# Patient Record
Sex: Male | Born: 1955 | Race: White | Hispanic: No | Marital: Married | Smoking: Never smoker
Health system: Southern US, Community
[De-identification: ages and names within clinical notes are randomized; demographics above are authoritative.]

## PROBLEM LIST (undated history)

## (undated) DIAGNOSIS — I712 Thoracic aortic aneurysm, without rupture: Secondary | ICD-10-CM

## (undated) DIAGNOSIS — R911 Solitary pulmonary nodule: Secondary | ICD-10-CM

## (undated) DIAGNOSIS — E785 Hyperlipidemia, unspecified: Secondary | ICD-10-CM

## (undated) DIAGNOSIS — W3400XA Accidental discharge from unspecified firearms or gun, initial encounter: Secondary | ICD-10-CM

## (undated) DIAGNOSIS — I10 Essential (primary) hypertension: Secondary | ICD-10-CM

## (undated) DIAGNOSIS — I2510 Atherosclerotic heart disease of native coronary artery without angina pectoris: Secondary | ICD-10-CM

## (undated) DIAGNOSIS — F419 Anxiety disorder, unspecified: Secondary | ICD-10-CM

## (undated) DIAGNOSIS — Z136 Encounter for screening for cardiovascular disorders: Secondary | ICD-10-CM

## (undated) HISTORY — PX: HERNIA REPAIR: SHX51

---

## 2000-09-16 ENCOUNTER — Emergency Department (HOSPITAL_COMMUNITY): Admission: EM | Admit: 2000-09-16 | Discharge: 2000-09-16 | Payer: Self-pay

## 2001-04-05 ENCOUNTER — Ambulatory Visit (HOSPITAL_COMMUNITY): Admission: RE | Admit: 2001-04-05 | Discharge: 2001-04-05 | Payer: Self-pay | Admitting: Orthopedic Surgery

## 2001-04-05 ENCOUNTER — Encounter: Payer: Self-pay | Admitting: Orthopedic Surgery

## 2010-02-18 ENCOUNTER — Emergency Department (HOSPITAL_COMMUNITY)
Admission: EM | Admit: 2010-02-18 | Discharge: 2010-02-18 | Payer: Self-pay | Source: Home / Self Care | Admitting: Emergency Medicine

## 2010-02-21 ENCOUNTER — Encounter (INDEPENDENT_AMBULATORY_CARE_PROVIDER_SITE_OTHER): Payer: Self-pay | Admitting: General Surgery

## 2010-02-21 ENCOUNTER — Ambulatory Visit
Admission: RE | Admit: 2010-02-21 | Discharge: 2010-02-21 | Payer: Self-pay | Source: Home / Self Care | Admitting: General Surgery

## 2010-05-28 LAB — DIFFERENTIAL
Basophils Absolute: 0 10*3/uL (ref 0.0–0.1)
Basophils Relative: 0 % (ref 0–1)
Eosinophils Absolute: 0.1 10*3/uL (ref 0.0–0.7)
Eosinophils Relative: 1 % (ref 0–5)
Lymphocytes Relative: 34 % (ref 12–46)
Lymphs Abs: 2.5 10*3/uL (ref 0.7–4.0)
Monocytes Absolute: 0.4 10*3/uL (ref 0.1–1.0)
Monocytes Relative: 5 % (ref 3–12)
Neutro Abs: 4.5 10*3/uL (ref 1.7–7.7)
Neutrophils Relative %: 60 % (ref 43–77)

## 2010-05-28 LAB — POCT I-STAT, CHEM 8
BUN: 15 mg/dL (ref 6–23)
Calcium, Ion: 1.02 mmol/L — ABNORMAL LOW (ref 1.12–1.32)
Chloride: 107 mEq/L (ref 96–112)
Creatinine, Ser: 1.2 mg/dL (ref 0.4–1.5)
Glucose, Bld: 122 mg/dL — ABNORMAL HIGH (ref 70–99)
HCT: 40 % (ref 39.0–52.0)
Hemoglobin: 13.6 g/dL (ref 13.0–17.0)
Potassium: 3.6 mEq/L (ref 3.5–5.1)
Sodium: 139 mEq/L (ref 135–145)
TCO2: 23 mmol/L (ref 0–100)

## 2010-05-28 LAB — CBC
HCT: 39.3 % (ref 39.0–52.0)
Hemoglobin: 13.4 g/dL (ref 13.0–17.0)
MCH: 30.4 pg (ref 26.0–34.0)
MCHC: 34.1 g/dL (ref 30.0–36.0)
MCV: 89.1 fL (ref 78.0–100.0)
Platelets: 208 10*3/uL (ref 150–400)
RBC: 4.41 MIL/uL (ref 4.22–5.81)
RDW: 12.4 % (ref 11.5–15.5)
WBC: 7.5 10*3/uL (ref 4.0–10.5)

## 2010-07-16 ENCOUNTER — Other Ambulatory Visit: Payer: Self-pay | Admitting: Surgery

## 2010-07-16 ENCOUNTER — Ambulatory Visit
Admission: RE | Admit: 2010-07-16 | Discharge: 2010-07-16 | Disposition: A | Discharge status: Home / Self Care | Payer: Self-pay | Source: Ambulatory Visit | Attending: Surgery | Admitting: Surgery

## 2010-07-16 DIAGNOSIS — Z01811 Encounter for preprocedural respiratory examination: Secondary | ICD-10-CM

## 2010-09-20 ENCOUNTER — Ambulatory Visit (INDEPENDENT_AMBULATORY_CARE_PROVIDER_SITE_OTHER): Payer: Worker's Compensation | Admitting: Surgery

## 2010-09-20 ENCOUNTER — Encounter (INDEPENDENT_AMBULATORY_CARE_PROVIDER_SITE_OTHER): Payer: Self-pay | Admitting: Surgery

## 2010-09-20 VITALS — BP 122/83 | HR 77 | Ht 70.0 in | Wt 205.0 lb

## 2010-09-20 DIAGNOSIS — Z8719 Personal history of other diseases of the digestive system: Secondary | ICD-10-CM

## 2010-09-20 DIAGNOSIS — Z9889 Other specified postprocedural states: Secondary | ICD-10-CM

## 2010-09-20 NOTE — Progress Notes (Signed)
Subjective:     Patient ID: Rodney Santos, male   DOB: 08/01/1955, 55 y.o.   MRN: [id number]    BP 122/83  Pulse 77  Ht 5\' 10"  (1.778 m)  Wt 205 lb (92.987 kg)  BMI 29.41 kg/m2    HPI The patient returns after laparoscopic repair of his right inguinal hernia. He seems to be doing okay. He denies any severe pain. He still has mild swelling of his right groin. Denies any redness or drainage from his incisions. He states he [place] the [place] while walking. Otherwise, he thinks he's doing well.  Review of Systems  Leg cramp and groin swelling     Objective:   Physical Exam  The abdominal incisions are well healed. Mild swelling of the right groin noted. No evidence of recurrent hernia with Valsalva. No redness.     Assessment:    Status post laparoscopic repair of right inguinal hernia Plan:    He is doing well. He will return to work in 10 days. He will have no restrictions. Followup as needed.

## 2010-09-20 NOTE — Patient Instructions (Signed)
Return to work on 09/30/2010.  No restrictions.  The swelling will go down over the next 2-3 months.  If the leg cramps continue call primary care.

## 2015-06-07 ENCOUNTER — Ambulatory Visit: Payer: Self-pay | Admitting: Allergy and Immunology

## 2015-11-07 ENCOUNTER — Encounter: Payer: Self-pay | Admitting: Sports Medicine

## 2015-11-07 ENCOUNTER — Ambulatory Visit (INDEPENDENT_AMBULATORY_CARE_PROVIDER_SITE_OTHER): Payer: BLUE CROSS/BLUE SHIELD | Admitting: Sports Medicine

## 2015-11-07 DIAGNOSIS — M722 Plantar fascial fibromatosis: Secondary | ICD-10-CM

## 2015-11-07 DIAGNOSIS — M25572 Pain in left ankle and joints of left foot: Secondary | ICD-10-CM

## 2015-11-07 DIAGNOSIS — M79672 Pain in left foot: Secondary | ICD-10-CM

## 2015-11-07 MED ORDER — METHYLPREDNISOLONE 4 MG PO TBPK: ORAL_TABLET | ORAL | 0 refills | Status: DC

## 2015-11-07 MED ORDER — DICLOFENAC SODIUM 75 MG PO TBEC: 75.0000 mg | DELAYED_RELEASE_TABLET | Freq: Two times a day (BID) | ORAL | 0 refills | Status: DC

## 2015-11-07 NOTE — Progress Notes (Signed)
  Subjective: Rodney Santos is a 60 y.o. male patient presents to office with complaint of heel pain on the left. Patient admits to post static dyskinesia for 1 month in duration that went away with stretching however 3 weeks ago mis-step from [employer] truck while going to load and felt a sharp stab to heel, pain now radiates to ankle and has hurt constantly since. Patient has treated this problem with stretching, icing, Motrin, and cup/brace with no relief. Denies any other pedal complaints.   There are no active problems to display for this patient.   No current outpatient prescriptions on file prior to visit.   No current facility-administered medications on file prior to visit.     No Known Allergies  Objective: Physical Exam General: The patient is alert and oriented x3 in no acute distress.  Dermatology: Skin is warm, dry and supple bilateral lower extremities. Nails 1-10 are normal. There is no erythema, edema, no eccymosis, no open lesions present. Integument is otherwise unremarkable.  Vascular: Dorsalis Pedis pulse and Posterior Tibial pulse are 2/4 bilateral. Capillary fill time is immediate to all digits.  Neurological: Grossly intact to light touch with an achilles reflex of +2/5 and a negative Tinel's sign bilateral.  Musculoskeletal: Tenderness to palpation at the medial calcaneal tubercale and through the insertion of the plantar fascia on the left foot with left ankle pain/compensation. No pain with compression of calcaneus bilateral. No pain with tuning fork to calcaneus bilateral. No pain with calf compression bilateral. There is decreased Ankle joint range of motion bilateral. All other joints range of motion within normal limits bilateral. Strength 5/5 in all groups bilateral.   Assessment and Plan: Problem List Items Addressed This Visit    None    Visit Diagnoses    Plantar fasciitis of left foot    -  Primary   vs partial tear   Relevant Medications   methylPREDNISolone (MEDROL DOSEPAK) 4 MG TBPK tablet   diclofenac (VOLTAREN) 75 MG EC tablet   Left foot pain       Relevant Medications   methylPREDNISolone (MEDROL DOSEPAK) 4 MG TBPK tablet   diclofenac (VOLTAREN) 75 MG EC tablet   Left ankle pain       Relevant Medications   methylPREDNISolone (MEDROL DOSEPAK) 4 MG TBPK tablet   diclofenac (VOLTAREN) 75 MG EC tablet     -Complete examination performed.  -Xrays not working to perform at next visit -Discussed with patient in detail the condition of plantar fasciitis with lateral ankle, how this occurs and general treatment options. Explained both conservative and surgical treatments.  -Rx Diclofenac to start after Medrol dose pack is completed -Recommended good supportive shoes  - Explained in detail the use of the Trilock brace which was dispensed at today's visit. -Recommend patient to ice affected area 1-2x daily. -Recommend to patient to consider leave from work if continues to flare up -Patient to return to office in 2 weeks for follow up or sooner if problems or questions arise. Xray at next visit and then MRI if not better.   Asencion Islamitorya Jamaia Brum, DPM

## 2015-11-21 ENCOUNTER — Ambulatory Visit (INDEPENDENT_AMBULATORY_CARE_PROVIDER_SITE_OTHER): Payer: BLUE CROSS/BLUE SHIELD | Admitting: Sports Medicine

## 2015-11-21 ENCOUNTER — Encounter: Payer: Self-pay | Admitting: Sports Medicine

## 2015-11-21 ENCOUNTER — Ambulatory Visit (INDEPENDENT_AMBULATORY_CARE_PROVIDER_SITE_OTHER): Payer: BLUE CROSS/BLUE SHIELD

## 2015-11-21 DIAGNOSIS — M79672 Pain in left foot: Secondary | ICD-10-CM

## 2015-11-21 DIAGNOSIS — M25572 Pain in left ankle and joints of left foot: Secondary | ICD-10-CM

## 2015-11-21 DIAGNOSIS — M722 Plantar fascial fibromatosis: Secondary | ICD-10-CM | POA: Diagnosis not present

## 2015-11-21 NOTE — Progress Notes (Signed)
  Subjective: Rodney Santos is a 60 y.o. male patient returns to office for follow-up evaluation of heel pain on left. Reports that he has no pain and symptoms. He feels 100% healed after taking medications, and wearing brace on left. States that he also bought new tennis shoes and a new over-the-counter insert which completely helps. Denies any other pedal complaints.   There are no active problems to display for this patient.   Current Outpatient Prescriptions on File Prior to Visit  Medication Sig Dispense Refill  . diclofenac (VOLTAREN) 75 MG EC tablet Take 1 tablet (75 mg total) by mouth 2 (two) times daily. 30 tablet 0  . methylPREDNISolone (MEDROL DOSEPAK) 4 MG TBPK tablet Take 1st as instructed 21 tablet 0  . minocycline (MINOCIN,DYNACIN) 75 MG capsule      No current facility-administered medications on file prior to visit.     No Known Allergies  Objective: Physical Exam General: The patient is alert and oriented x3 in no acute distress.  Dermatology: Skin is warm, dry and supple bilateral lower extremities. Nails 1-10 are normal. There is no erythema, edema, no eccymosis, no open lesions present. Integument is otherwise unremarkable.  Vascular: Dorsalis Pedis pulse and Posterior Tibial pulse are 2/4 bilateral. Capillary fill time is immediate to all digits.  Neurological: Grossly intact to light touch with an achilles reflex of +2/5 and a negative Tinel's sign bilateral.  Musculoskeletal: No tenderness to palpation at the medial calcaneal tubercale and through the insertion of the plantar fascia on the left foot with resolved left ankle pain/compensation. No pain with compression of calcaneus bilateral. No pain with tuning fork to calcaneus bilateral. No pain with calf compression bilateral. There is decreased Ankle joint range of motion bilateral. All other joints range of motion within normal limits bilateral. Strength 5/5 in all groups bilateral.   X-rays left foot.  Normal osseous mineralization. 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 No other acute findings.  Assessment and Plan: Problem List Items Addressed This Visit    None    Visit Diagnoses    Left foot pain    -  Primary   Relevant Orders   DG Foot 2 Views Left   Plantar fasciitis of left foot       Left ankle pain         -Complete examination performed.  -Xrays Were obtained and reviewed -Discussed with patient in detail the condition continued care to prevent recurrence of plantar fasciitis with lateral ankle -Continue with diclofenac until completed -Recommended good supportive shoes and over-the-counter inserts - Wean slowly from Tri-Lock brace as instructed -Recommend patient to ice affected area 1-2x daily as needed -Continue with regular duty at work no restrictions -Patient to return to office as needed or sooner if problems or questions arise.  Asencion Islamitorya Brenn Deziel, DPM

## 2016-02-18 ENCOUNTER — Emergency Department (HOSPITAL_COMMUNITY)
Admission: EM | Admit: 2016-02-18 | Discharge: 2016-02-18 | Disposition: A | Discharge status: Home / Self Care | Payer: Worker's Compensation | Attending: Emergency Medicine | Admitting: Emergency Medicine

## 2016-02-18 ENCOUNTER — Emergency Department (HOSPITAL_COMMUNITY): Payer: Worker's Compensation

## 2016-02-18 ENCOUNTER — Encounter (HOSPITAL_COMMUNITY): Payer: Self-pay | Admitting: *Deleted

## 2016-02-18 DIAGNOSIS — Y990 Civilian activity done for income or pay: Secondary | ICD-10-CM | POA: Diagnosis not present

## 2016-02-18 DIAGNOSIS — S61432A Puncture wound without foreign body of left hand, initial encounter: Secondary | ICD-10-CM | POA: Insufficient documentation

## 2016-02-18 DIAGNOSIS — S6992XA Unspecified injury of left wrist, hand and finger(s), initial encounter: Secondary | ICD-10-CM | POA: Diagnosis present

## 2016-02-18 DIAGNOSIS — Y9289 Other specified places as the place of occurrence of the external cause: Secondary | ICD-10-CM | POA: Insufficient documentation

## 2016-02-18 DIAGNOSIS — Z23 Encounter for immunization: Secondary | ICD-10-CM | POA: Diagnosis not present

## 2016-02-18 DIAGNOSIS — Y9389 Activity, other specified: Secondary | ICD-10-CM | POA: Insufficient documentation

## 2016-02-18 DIAGNOSIS — W270XXA Contact with workbench tool, initial encounter: Secondary | ICD-10-CM | POA: Insufficient documentation

## 2016-02-18 HISTORY — DX: Anxiety disorder, unspecified: F41.9

## 2016-02-18 MED ORDER — TETANUS-DIPHTH-ACELL PERTUSSIS 5-2.5-18.5 LF-MCG/0.5 IM SUSP
0.5000 mL | Freq: Once | INTRAMUSCULAR | Status: AC
  Administered 2016-02-18: 0.5 mL via INTRAMUSCULAR
  Filled 2016-02-18: qty 0.5

## 2016-02-18 MED ORDER — CEFAZOLIN SODIUM 1 G IJ SOLR
500.0000 mg | Freq: Once | INTRAMUSCULAR | Status: AC
  Administered 2016-02-18: 500 mg via INTRAMUSCULAR
  Filled 2016-02-18: qty 10

## 2016-02-18 MED ORDER — HYDROCODONE-ACETAMINOPHEN 5-325 MG PO TABS
1.0000 | ORAL_TABLET | Freq: Once | ORAL | Status: AC
  Administered 2016-02-18: 1 via ORAL
  Filled 2016-02-18: qty 1

## 2016-02-18 MED ORDER — SULFAMETHOXAZOLE-TRIMETHOPRIM 800-160 MG PO TABS: 1.0000 | ORAL_TABLET | Freq: Two times a day (BID) | ORAL | 0 refills | Status: DC

## 2016-02-18 NOTE — ED Provider Notes (Signed)
[facility]-EMERGENCY DEPT Provider Note   CSN: [id number] Arrival date & time: 02/18/16  [id number]  By signing my name below, I, Javier Dockerobert Ryan Halas, attest that this documentation has been prepared under the direction and in the presence of Benedetto GoadKennith Baylon Santelli, PA. Electronically Signed: Javier Dockerobert Ryan Halas, ER Scribe. 10/27/2015. 10:04 AM.   History   Chief Complaint Chief Complaint  Patient presents with  . Hand Injury   The history is provided by the patient.    HPI Comments Rodney Santos is a 60 y.o. male who presents to the Emergency Department complaining of an injury to his left hand that happened two hours ago at work. He was using a screw driver with his right hand and the screwdriver slipped and stabbed his left hand. He states the screwdriver did not pass fully through the hand but there was tenting of the skin on the posterior portion of the had from the screw driver. He states when he pulled the screwdriver out of his hand there was immediate significant swelling on the mid posterior hand. He endorses current numbness in his finger tips. He also endorses unable to fully flex his left index fingers and is painful. Pain was acute in onset. Moving makes pain worse. Nothing makes pain better. He has tried nothing for the pain prior to arrival.   Past Medical History:  Diagnosis Date  . Anxiety     There are no active problems to display for this patient.   Past Surgical History:  Procedure Laterality Date  . HERNIA REPAIR         Home Medications    Prior to Admission medications   Medication Sig Start Date End Date Taking? Authorizing Provider  diclofenac (VOLTAREN) 75 MG EC tablet Take 1 tablet (75 mg total) by mouth 2 (two) times daily. 11/07/15   Asencion Islamitorya Stover, DPM  methylPREDNISolone (MEDROL DOSEPAK) 4 MG TBPK tablet Take 1st as instructed 11/07/15   Asencion Islamitorya Stover, DPM  minocycline (MINOCIN,DYNACIN) 75 MG capsule  08/23/15   Historical Provider, MD    Family History No  family history on file.  Social History Social History  Substance Use Topics  . Smoking status: Never Smoker  . Smokeless tobacco: Never Used  . Alcohol use Yes     Allergies   Patient has no known allergies.   Review of Systems Review of Systems  Constitutional: Negative for chills and fever.  Respiratory: Negative for chest tightness and shortness of breath.   Cardiovascular: Negative for chest pain.  Skin: Positive for color change and wound.  Neurological: Positive for weakness and numbness. Negative for dizziness.     Physical Exam Updated Vital Signs BP 143/100 (BP Location: Right Arm)   Pulse 71   Temp 98.2 F (36.8 C) (Oral)   Resp 16   Ht 5\' 10"  (1.778 m)   Wt 93 kg   SpO2 98%   BMI 29.41 kg/m   Physical Exam  Constitutional: He is oriented to person, place, and time. He appears well-developed and well-nourished. No distress.  HENT:  Head: Normocephalic and atraumatic.  Eyes: Pupils are equal, round, and reactive to light.  Neck: Neck supple.  Cardiovascular: Normal rate.   Pulmonary/Chest: Effort normal. No respiratory distress.  Musculoskeletal: Normal range of motion.  Patient with puncture wound to the anterior left palmar surface of the hand between the 1st and 2nd metacarpal. Bleeding is controlled. There is significant amount of dorsal tenting and swelling with mild ecchymosis. No erythema noted. No crepitus  or deformity noted. Patient with full range of motion of left wrist and phalanges. Pain with flexion of left index finger but limited due to pain. Cap refill is normal. Sensation intact. Radial pulses are 2+ bilaterally. Strength is 5 out of 5. Patient with mild tenderness to palpation over the puncture wound and area of edema.  Neurological: He is alert and oriented to person, place, and time. Coordination normal.  Skin: Skin is warm and dry. Capillary refill takes less than 2 seconds. He is not diaphoretic.  Psychiatric: He has a normal mood and  affect. His behavior is normal.  Nursing note and vitals reviewed.    ED Treatments / Results  Labs (all labs ordered are listed, but only abnormal results are displayed) Labs Reviewed - No data to display  EKG  EKG Interpretation None       Radiology Dg Hand Complete Left  Result Date: 02/18/2016 CLINICAL DATA:  Puncture wound from screwdriver. EXAM: LEFT HAND - COMPLETE 3+ VIEW COMPARISON:  None. FINDINGS: There is no evidence of fracture or dislocation. There is no evidence of arthropathy or other focal bone abnormality. Soft tissues are unremarkable. IMPRESSION: No significant abnormality seen in the left hand. No radiopaque foreign body seen. Electronically Signed   By: Lupita Raider, M.D.   On: 02/18/2016 11:44    Procedures Procedures (including critical care time)  Medications Ordered in ED Medications  Tdap (BOOSTRIX) injection 0.5 mL (0.5 mLs Intramuscular Given 02/18/16 1023)  HYDROcodone-acetaminophen (NORCO/VICODIN) 5-325 MG per tablet 1 tablet (1 tablet Oral Given 02/18/16 1023)  ceFAZolin (ANCEF) injection 500 mg (500 mg Intramuscular Given 02/18/16 1437)     Initial Impression / Assessment and Plan / ED Course  I have reviewed the triage vital signs and the nursing notes.  Pertinent labs & imaging results that were available during my care of the patient were reviewed by me and considered in my medical decision making (see chart for details).  Clinical Course    Patient presents with injury to the left hand. No signs of infection at this time. Xray without any acute bony abnormalities and no subcutaneous air noted. Patient with limited flexion of the left index finger but is able to perform minimal flexion. Edema noted to the dorsum of the left hand. No exit wound. Tdap was given. Patient was seen and examined by Dr. Ranae Palms who agrees to dose of ancef in the ED and to discharge patient home on bactrim. I spoke with Dr. Orlan Leavens with ortho who agrees to see  patient in the office on Thursday. He does not feel that the patient needs consultation in the ED. I have placed patient in a thumb spica splint. I have given him a prescription for bactrim. I have informed patient to return to the ED in 24 hours for wound recheck since he will not be able to follow up with ortho until Thursday. Dr. Ranae Palms saw and examined patient and is agreeable to the above plan. Pt is hemodynamically stable, in NAD, & able to ambulate in the ED. Pain has been managed & has no complaints prior to dc. Pt is comfortable with above plan and is stable for discharge at this time. All questions were answered prior to disposition. Strict return precautions for f/u to the ED were discussed.    MDM   Final Clinical Impressions(s) / ED Diagnoses   Final diagnoses:  Puncture wound of left hand without foreign body, initial encounter    New Prescriptions Discharge Medication  List as of 02/18/2016  3:31 PM    START taking these medications   Details  sulfamethoxazole-trimethoprim (BACTRIM DS,SEPTRA DS) 800-160 MG tablet Take 1 tablet by mouth 2 (two) times daily., Starting Mon 02/18/2016, Print        I personally performed the services described in this documentation, which was scribed in my presence. The recorded information has been reviewed and is accurate.        Rise MuKenneth T Avarie Tavano, PA-C 02/19/16 0012    Loren Raceravid Yelverton, MD 02/23/16 [phone]

## 2016-02-18 NOTE — ED Triage Notes (Addendum)
Pt was using screw driver and it slipped and went into his hand, almost through to the other side.  Full rom.  Hematoma to dorsal aspect of hand.

## 2016-02-18 NOTE — Discharge Instructions (Signed)
Please keep the splint on. Take the antibiotics twice a day. Continue to use Motrin and Tylenol for pain and swelling. Apply ice to the hand. Please return in 24 hours for a wound recheck. Please call Dr. Glenna Durandrtmann's office to schedule an appointment.

## 2016-02-18 NOTE — ED Notes (Signed)
Declined W/C at D/C and was escorted to lobby by RN. 

## 2016-02-18 NOTE — ED Triage Notes (Signed)
Ortho at bedside.

## 2016-02-18 NOTE — Progress Notes (Signed)
Orthopedic Tech Progress Note Patient Details:  Rodney Santos Aug 06, 1955 [id number]  Ortho Devices Type of Ortho Device: Ace wrap, Thumb velcro splint Ortho Device/Splint Location: Applied White Thumb Velcro Splint to Left hand with Ace Wrap as per Doctor's Orders. Ortho Device/Splint Interventions: Application   Alvina ChouWilliams, Minela Bridge[place] C 02/18/2016, 3:10 PM

## 2016-02-19 ENCOUNTER — Emergency Department (HOSPITAL_COMMUNITY)
Admission: EM | Admit: 2016-02-19 | Discharge: 2016-02-19 | Disposition: A | Discharge status: Home / Self Care | Payer: Worker's Compensation | Attending: Emergency Medicine | Admitting: Emergency Medicine

## 2016-02-19 ENCOUNTER — Encounter (HOSPITAL_COMMUNITY): Payer: Self-pay | Admitting: Emergency Medicine

## 2016-02-19 DIAGNOSIS — Z4800 Encounter for change or removal of nonsurgical wound dressing: Secondary | ICD-10-CM | POA: Diagnosis not present

## 2016-02-19 DIAGNOSIS — Z5189 Encounter for other specified aftercare: Secondary | ICD-10-CM

## 2016-02-19 MED ORDER — HYDROCODONE-ACETAMINOPHEN 5-325 MG PO TABS: 1.0000 | ORAL_TABLET | ORAL | 0 refills | Status: DC | PRN

## 2016-02-19 NOTE — Discharge Instructions (Signed)
Take the prescribed medication as directed.  Use caution, can make you sleepy/drowsy. Follow-up with Dr. Melvyn Novasrtmann-- make sure to follow-up with his office. Return to the ED for new or worsening symptoms.

## 2016-02-19 NOTE — ED Triage Notes (Signed)
Pt states he injured his left hand at work and was seen here for treatment. Pt was told to come back and have wound check. Pt denies any new complaints

## 2016-02-19 NOTE — ED Provider Notes (Signed)
[facility]PT Provider Note   CSN: [id number] Arrival date & time: 02/19/16  [id number]     History   Chief Complaint Chief Complaint  Patient presents with  . Hand Injury    HPI Rodney Santos is a 60 y.o. male.  The history is provided by the patient and medical records.  Hand Injury      60 y.o. M here with left hand injury.  States he was seen here yesterday after screwdriver punctured his left hand.  Patient is right hand dominant.  States pulled screwdriver out but had significant swelling of left hand shortly after.  He was seen here, given IV abx and d/c home on bactrim.  He was also placed in a wrist splint which he reports has helped significantly with the swelling. States overall he feels that pain and swelling of the hand is improving. He denies any fever or chills. No numbness or weakness of his left hand. His tetanus was updated yesterday. He was given hand surgery follow-up on Thursday, however when he called yesterday to confirm his appointment, he was told that his supervisor would have to make his appointment since it was [employer]. States this is an process currently.  Tetanus was updated yesterday.  Past Medical History:  Diagnosis Date  . Anxiety     There are no active problems to display for this patient.   Past Surgical History:  Procedure Laterality Date  . HERNIA REPAIR         Home Medications    Prior to Admission medications   Medication Sig Start Date End Date Taking? Authorizing Provider  diclofenac (VOLTAREN) 75 MG EC tablet Take 1 tablet (75 mg total) by mouth 2 (two) times daily. 11/07/15   Asencion Islamitorya Stover, DPM  methylPREDNISolone (MEDROL DOSEPAK) 4 MG TBPK tablet Take 1st as instructed 11/07/15   Asencion Islamitorya Stover, DPM  minocycline (MINOCIN,DYNACIN) 75 MG capsule  08/23/15   Historical Provider, MD  sulfamethoxazole-trimethoprim (BACTRIM DS,SEPTRA DS) 800-160 MG tablet Take 1 tablet by mouth 2 (two) times daily. 02/18/16   Rise MuKenneth T  Leaphart, PA-C    Family History History reviewed. No pertinent family history.  Social History Social History  Substance Use Topics  . Smoking status: Never Smoker  . Smokeless tobacco: Never Used  . Alcohol use Yes     Allergies   Patient has no known allergies.   Review of Systems Review of Systems  Musculoskeletal: Positive for arthralgias.  Skin: Positive for wound.  All other systems reviewed and are negative.    Physical Exam Updated Vital Signs BP 137/96 (BP Location: Right Arm)   Pulse 74   Temp 99.4 F (37.4 C) (Oral)   Resp 20   Ht 5\' 10"  (1.778 m)   Wt 93 kg   SpO2 98%   BMI 29.41 kg/m   Physical Exam  Constitutional: He is oriented to person, place, and time. He appears well-developed and well-nourished.  HENT:  Head: Normocephalic and atraumatic.  Mouth/Throat: Oropharynx is clear and moist.  Eyes: Conjunctivae and EOM are normal. Pupils are equal, round, and reactive to light.  Neck: Normal range of motion.  Cardiovascular: Normal rate, regular rhythm and normal heart sounds.   Pulmonary/Chest: Effort normal and breath sounds normal.  Abdominal: Soft. Bowel sounds are normal.  Musculoskeletal: Normal range of motion.  Left hand with puncture wound noted between 1st and 2nd metacarpals on palmar aspect of hand; no drainage, surrounding erythema, or induration; mild swelling on dorsal surface  of hand, however so skin tenting today; full ROM of all fingers, some pain with flexion of the left index finger; strong radial pulse and cap refill; normal sensation throughout hand  Neurological: He is alert and oriented to person, place, and time.  Skin: Skin is warm and dry.  Psychiatric: He has a normal mood and affect.  Nursing note and vitals reviewed.    ED Treatments / Results  Labs (all labs ordered are listed, but only abnormal results are displayed) Labs Reviewed - No data to display  EKG  EKG Interpretation None       Radiology Dg  Hand Complete Left  Result Date: 02/18/2016 CLINICAL DATA:  Puncture wound from screwdriver. EXAM: LEFT HAND - COMPLETE 3+ VIEW COMPARISON:  None. FINDINGS: There is no evidence of fracture or dislocation. There is no evidence of arthropathy or other focal bone abnormality. Soft tissues are unremarkable. IMPRESSION: No significant abnormality seen in the left hand. No radiopaque foreign body seen. Electronically Signed   By: Lupita RaiderJames  Green Jr, M.D.   On: 02/18/2016 11:44    Procedures Procedures (including critical care time)  Medications Ordered in ED Medications - No data to display   Initial Impression / Assessment and Plan / ED Course  I have reviewed the triage vital signs and the nursing notes.  Pertinent labs & imaging results that were available during my care of the patient were reviewed by me and considered in my medical decision making (see chart for details).  Clinical Course    60 year old male here with recheck of the left hand after he was puncture with a screwdriver yesterday. Based on physical exam findings from yesterday, it seems this is improved. Puncture wound remains without any signs of infection. Swelling has decreased. Patient continues to have some pain with movement of the left index finger, likely due to location of wound.  Hand remains neurovascularly intact.  Splint reapplied.  Continue bactrim, add vicodin for pain control.  FU with hand surgery later this week as recommended.  Discussed plan with patient, he acknowledged understanding and agreed with plan of care.  Return precautions given for new or worsening symptoms.  Final Clinical Impressions(s) / ED Diagnoses   Final diagnoses:  Encounter for wound re-check    New Prescriptions New Prescriptions   HYDROCODONE-ACETAMINOPHEN (NORCO/VICODIN) 5-325 MG TABLET    Take 1 tablet by mouth every 4 (four) hours as needed.     Garlon HatchetLisa M Azekiel Cremer, PA-C 02/19/16 [id number]    Lyndal Pulleyaniel Knott, MD 02/19/16 [phone]

## 2016-04-01 ENCOUNTER — Encounter

## 2016-04-18 ENCOUNTER — Inpatient Hospital Stay: Admit: 2016-04-18 | Payer: Self-pay | Primary: Family Medicine

## 2016-04-18 DIAGNOSIS — Z136 Encounter for screening for cardiovascular disorders: Secondary | ICD-10-CM

## 2016-04-24 NOTE — Telephone Encounter (Signed)
Called patient to review his CT scan results. Patient was unavailable. Left a voice mail message for patient to return my call. Return phone number given in message. Awaiting his return call.

## 2016-04-24 NOTE — Telephone Encounter (Signed)
Patient returned the call that was left for him earlier today to review his CT scan results. Verified date of birth. Discussed results. His calcium score is 61. This corresponds to a small amount of plaque noted in the coronary arteries. His risk for a heart attack is  low. It is reccommended that he  follow up with his PCP, Marin Commentheryl Wells, to make sure that any and all risk factors are being optimally managed. Patient verbalized understanding of results. Will mail report to the patient at his request.

## 2016-07-30 NOTE — Telephone Encounter (Signed)
Patient called and stated that he had misplaced his copy of CT scan results. He was applying for life insurance and needed to have the results of this test. He requested another copy be sent to [place]. Verified the address and sent out another copy to him. Patient appreciated the willingness to resend.

## 2017-04-15 ENCOUNTER — Encounter

## 2017-04-17 ENCOUNTER — Inpatient Hospital Stay: Admit: 2017-04-17 | Payer: Self-pay | Primary: Family Medicine

## 2017-04-17 DIAGNOSIS — I712 Thoracic aortic aneurysm, without rupture, unspecified: Secondary | ICD-10-CM

## 2017-04-17 DIAGNOSIS — R911 Solitary pulmonary nodule: Secondary | ICD-10-CM

## 2017-04-17 DIAGNOSIS — I2510 Atherosclerotic heart disease of native coronary artery without angina pectoris: Secondary | ICD-10-CM

## 2017-04-17 DIAGNOSIS — E785 Hyperlipidemia, unspecified: Secondary | ICD-10-CM

## 2017-04-17 DIAGNOSIS — Z136 Encounter for screening for cardiovascular disorders: Secondary | ICD-10-CM

## 2017-04-17 HISTORY — DX: Hyperlipidemia, unspecified: E78.5

## 2017-04-17 HISTORY — DX: Thoracic aortic aneurysm, without rupture, unspecified: I71.20

## 2017-04-17 HISTORY — DX: Solitary pulmonary nodule: R91.1

## 2017-04-17 HISTORY — DX: Thoracic aortic aneurysm, without rupture: I71.2

## 2017-04-17 HISTORY — DX: Atherosclerotic heart disease of native coronary artery without angina pectoris: I25.10

## 2017-05-07 ENCOUNTER — Encounter (HOSPITAL_COMMUNITY): Payer: Self-pay

## 2017-05-07 ENCOUNTER — Emergency Department (HOSPITAL_COMMUNITY): Payer: BLUE CROSS/BLUE SHIELD

## 2017-05-07 ENCOUNTER — Other Ambulatory Visit: Payer: Self-pay

## 2017-05-07 ENCOUNTER — Inpatient Hospital Stay (HOSPITAL_COMMUNITY)
Admission: EM | Admit: 2017-05-07 | Discharge: 2017-05-09 | DRG: 247 | Disposition: A | Discharge status: Home / Self Care | Payer: BLUE CROSS/BLUE SHIELD | Attending: Internal Medicine | Admitting: Internal Medicine

## 2017-05-07 DIAGNOSIS — Z8249 Family history of ischemic heart disease and other diseases of the circulatory system: Secondary | ICD-10-CM

## 2017-05-07 DIAGNOSIS — R911 Solitary pulmonary nodule: Secondary | ICD-10-CM | POA: Diagnosis present

## 2017-05-07 DIAGNOSIS — R079 Chest pain, unspecified: Secondary | ICD-10-CM | POA: Diagnosis not present

## 2017-05-07 DIAGNOSIS — I2510 Atherosclerotic heart disease of native coronary artery without angina pectoris: Secondary | ICD-10-CM | POA: Diagnosis present

## 2017-05-07 DIAGNOSIS — I712 Thoracic aortic aneurysm, without rupture: Secondary | ICD-10-CM | POA: Diagnosis not present

## 2017-05-07 DIAGNOSIS — I719 Aortic aneurysm of unspecified site, without rupture: Secondary | ICD-10-CM | POA: Diagnosis present

## 2017-05-07 DIAGNOSIS — Z791 Long term (current) use of non-steroidal anti-inflammatories (NSAID): Secondary | ICD-10-CM

## 2017-05-07 DIAGNOSIS — Z79899 Other long term (current) drug therapy: Secondary | ICD-10-CM

## 2017-05-07 DIAGNOSIS — K219 Gastro-esophageal reflux disease without esophagitis: Secondary | ICD-10-CM | POA: Diagnosis present

## 2017-05-07 DIAGNOSIS — I25110 Atherosclerotic heart disease of native coronary artery with unstable angina pectoris: Secondary | ICD-10-CM | POA: Diagnosis not present

## 2017-05-07 DIAGNOSIS — Z955 Presence of coronary angioplasty implant and graft: Secondary | ICD-10-CM

## 2017-05-07 DIAGNOSIS — Z880 Allergy status to penicillin: Secondary | ICD-10-CM

## 2017-05-07 DIAGNOSIS — E785 Hyperlipidemia, unspecified: Secondary | ICD-10-CM | POA: Diagnosis present

## 2017-05-07 DIAGNOSIS — R402413 Glasgow coma scale score 13-15, at hospital admission: Secondary | ICD-10-CM | POA: Diagnosis present

## 2017-05-07 DIAGNOSIS — I200 Unstable angina: Secondary | ICD-10-CM

## 2017-05-07 HISTORY — DX: Thoracic aortic aneurysm, without rupture: I71.2

## 2017-05-07 HISTORY — DX: Hyperlipidemia, unspecified: E78.5

## 2017-05-07 HISTORY — DX: Solitary pulmonary nodule: R91.1

## 2017-05-07 HISTORY — DX: Atherosclerotic heart disease of native coronary artery without angina pectoris: I25.10

## 2017-05-07 LAB — CBC
HCT: 45 % (ref 39.0–52.0)
Hemoglobin: 15.5 g/dL (ref 13.0–17.0)
MCH: 31.1 pg (ref 26.0–34.0)
MCHC: 34.4 g/dL (ref 30.0–36.0)
MCV: 90.2 fL (ref 78.0–100.0)
Platelets: 284 10*3/uL (ref 150–400)
RBC: 4.99 MIL/uL (ref 4.22–5.81)
RDW: 12.7 % (ref 11.5–15.5)
WBC: 6 10*3/uL (ref 4.0–10.5)

## 2017-05-07 LAB — BASIC METABOLIC PANEL
Anion gap: 11 (ref 5–15)
BUN: 12 mg/dL (ref 6–20)
CO2: 22 mmol/L (ref 22–32)
Calcium: 9 mg/dL (ref 8.9–10.3)
Chloride: 103 mmol/L (ref 101–111)
Creatinine, Ser: 0.97 mg/dL (ref 0.61–1.24)
GFR calc Af Amer: >60 mL/min (ref >60)
GFR calc non Af Amer: >60 mL/min (ref >60)
Glucose, Bld: 114 mg/dL — ABNORMAL HIGH (ref 65–99)
Potassium: 3.7 mmol/L (ref 3.5–5.1)
Sodium: 136 mmol/L (ref 135–145)

## 2017-05-07 LAB — POCT I-STAT TROPONIN I: Troponin i, poc: 0 ng/mL (ref 0.00–0.08)

## 2017-05-07 LAB — I-STAT TROPONIN, ED
Troponin i, poc: 0 ng/mL (ref 0.00–0.08)
Troponin i, poc: 0 ng/mL (ref 0.00–0.08)

## 2017-05-07 MED ORDER — ASPIRIN 81 MG PO CHEW
324.0000 mg | CHEWABLE_TABLET | Freq: Once | ORAL | Status: AC
  Administered 2017-05-07: 324 mg via ORAL
  Filled 2017-05-07: qty 4

## 2017-05-07 MED ORDER — ENOXAPARIN SODIUM 40 MG/0.4ML ~~LOC~~ SOLN
40.0000 mg | SUBCUTANEOUS | Status: DC
  Administered 2017-05-07: 40 mg via SUBCUTANEOUS
  Filled 2017-05-07: qty 0.4

## 2017-05-07 MED ORDER — NITROGLYCERIN 0.4 MG SL SUBL
0.4000 mg | SUBLINGUAL_TABLET | SUBLINGUAL | Status: DC | PRN
  Administered 2017-05-07: 0.4 mg via SUBLINGUAL
  Filled 2017-05-07: qty 1

## 2017-05-07 MED ORDER — PANTOPRAZOLE SODIUM 40 MG PO TBEC
40.0000 mg | DELAYED_RELEASE_TABLET | Freq: Every day | ORAL | Status: DC
  Administered 2017-05-07 – 2017-05-09 (×3): 40 mg via ORAL
  Filled 2017-05-07 (×3): qty 1

## 2017-05-07 MED ORDER — ATORVASTATIN CALCIUM 80 MG PO TABS
80.0000 mg | ORAL_TABLET | Freq: Every day | ORAL | Status: DC
  Administered 2017-05-08: 19:00:00 80 mg via ORAL
  Filled 2017-05-07: qty 1

## 2017-05-07 MED ORDER — ASPIRIN EC 325 MG PO TBEC
325.0000 mg | DELAYED_RELEASE_TABLET | Freq: Every day | ORAL | Status: DC
  Administered 2017-05-08: 325 mg via ORAL
  Filled 2017-05-07: qty 1

## 2017-05-07 NOTE — ED Provider Notes (Signed)
[facility] PROGRESSIVE CARE Provider Note   CSN: [id number] Arrival date & time: 05/07/17  1245     History   Chief Complaint Chief Complaint  Patient presents with  . Chest Pain    HPI Rodney Santos is a 62 y.o. male.  HPI  62 year old male history of anxiety, with 4.4 cm, presents emergency department with 3 months of waxing waning intermittent left-sided chest discomfort described as a dull ache/pressure while at rest at times lasting greater than 15 minutes.  Patient states having a recent CT Noncon of the chest with a calcium score 75 and the incidental finding of the aneurysm.  Patient reports emergency department today with left-sided chest discomfort that is near subsided still residual dull ache.  Patient denies any recent illness/trauma, lower extremity edema or prior PE/DVT.  Patient's baseline functional status is ambulatory working for [employer].  No family history on maternal side for rupture of thoracic aortic aneurysm.   Past Medical History:  Diagnosis Date  . Anxiety   . CAD (coronary artery disease) 04/2017   noted on CT scan [facility])  . Dyslipidemia 04/2017   LDL 129  . Lung nodule 04/2017   4 mm LLL  . Thoracic aortic aneurysm (HCC) 04/2017   4.4 cm by CT [facility])    Patient Active Problem List   Diagnosis Date Noted  . Dyslipidemia 05/08/2017  . Chest pain with moderate risk of acute coronary syndrome 05/07/2017  . Aortic aneurysm (HCC) 05/07/2017  . Lung nodule 05/07/2017  . CAD (coronary artery disease) 04/17/2017    Past Surgical History:  Procedure Laterality Date  . HERNIA REPAIR         Home Medications    Prior to Admission medications   Medication Sig Start Date End Date Taking? Authorizing Provider  omeprazole (PRILOSEC) 20 MG capsule Take 20 mg by mouth daily.   Yes [provider]  Turmeric 500 MG TABS Take 1,000 mg by mouth daily.   Yes [provider]  diclofenac (VOLTAREN) 75 MG EC tablet Take 1  tablet (75 mg total) by mouth 2 (two) times daily. 11/07/15   Asencion IslamStover, Titorya, DPM  HYDROcodone-acetaminophen (NORCO/VICODIN) 5-325 MG tablet Take 1 tablet by mouth every 4 (four) hours as needed. 02/19/16   Garlon HatchetSanders, Lisa M, PA-C  methylPREDNISolone (MEDROL DOSEPAK) 4 MG TBPK tablet Take 1st as instructed 11/07/15   Asencion IslamStover, Titorya, DPM  sulfamethoxazole-trimethoprim (BACTRIM DS,SEPTRA DS) 800-160 MG tablet Take 1 tablet by mouth 2 (two) times daily. Patient not taking: Reported on 05/07/2017 02/18/16   Rise MuLeaphart, Kenneth T, PA-C    Family History Family History  Problem Relation Age of Onset  . Aortic aneurysm Mother   . Hypertension Father     Social History Social History   Tobacco Use  . Smoking status: Never Smoker  . Smokeless tobacco: Never Used  Substance Use Topics  . Alcohol use: Yes  . Drug use: No     Allergies   Penicillins   Review of Systems Review of Systems  Review of Systems  Constitutional: Negative for fever and chills.  HENT: Negative for ear pain, sore throat and trouble swallowing.   Eyes: Negative for pain and visual disturbance.  Respiratory: Negative for cough and shortness of breath.   Cardiovascular: see HPI Gastrointestinal: Negative for nausea, vomiting, abdominal pain and diarrhea.  Genitourinary: Negative for dysuria, urgency and frequency.  Musculoskeletal: Negative for back pain and joint swelling.  Skin: Negative for rash and wound.  Neurological: Negative for dizziness, syncope, speech difficulty, weakness and numbness.   Physical Exam Updated Vital Signs BP 114/86 (BP Location: Right Arm)   Pulse 67   Temp 98 F (36.7 C) (Oral)   Resp 18   Ht 5\' 10"  (1.778 m)   Wt 93.8 kg (206 lb 11.2 oz)   SpO2 95%   BMI 29.66 kg/m   Physical Exam  Physical Exam Vitals:   05/08/17 0502 05/08/17 0700  BP: 110/82 114/86  Pulse: 67   Resp: 18   Temp: 98.4 F (36.9 C) 98 F (36.7 C)  SpO2: 95%    Constitutional: Patient is in no acute  distress Head: Normocephalic and atraumatic.  Eyes: Extraocular motion intact, no scleral icterus Neck: Supple without meningismus, mass, or overt JVD Respiratory: Effort normal and breath sounds normal. No respiratory distress. CV: Heart regular rate and rhythm, no obvious murmurs.  Pulses +2 and symmetric Abdomen: Soft, non-tender, non-distended MSK: Extremities are atraumatic without deformity, ROM intact Skin: Warm, dry, intact Neuro: Alert and oriented, no motor deficit noted Psychiatric: Mood and affect are normal.  ED Treatments / Results  Labs (all labs ordered are listed, but only abnormal results are displayed) Labs Reviewed  BASIC METABOLIC PANEL - Abnormal; Notable for the following components:      Result Value   Glucose, Bld 114 (*)    All other components within normal limits  COMPREHENSIVE METABOLIC PANEL - Abnormal; Notable for the following components:   Calcium 8.7 (*)    Total Protein 6.3 (*)    Total Bilirubin 1.4 (*)    All other components within normal limits  LIPID PANEL - Abnormal; Notable for the following components:   Cholesterol 212 (*)    Triglycerides 287 (*)    HDL 26 (*)    VLDL 57 (*)    LDL Cholesterol 129 (*)    All other components within normal limits  MRSA PCR SCREENING  CBC  CBC  HIV ANTIBODY (ROUTINE TESTING)  I-STAT TROPONIN, ED  I-STAT TROPONIN, ED  I-STAT TROPONIN, ED  POCT I-STAT TROPONIN I  I-STAT TROPONIN, ED    EKG  EKG Interpretation  Date/Time:  Thursday May 07 2017 12:49:48 EST Ventricular Rate:  96 PR Interval:  158 QRS Duration: 88 QT Interval:  346 QTC Calculation: 437 R Axis:   -22 Text Interpretation:  Normal sinus rhythm Normal ECG No previous ECGs available Confirmed by Frederick Peers [phone]) on 05/07/2017 4:39:53 PM       Radiology Dg Chest 2 View  Result Date: 05/07/2017 CLINICAL DATA:  Left chest pain radiating to back which shortness-of-breath. Thoracic aortic aneurysm diagnosed 3 months  ago. Cough. EXAM: CHEST  2 VIEW COMPARISON:  07/16/2010 FINDINGS: Lungs are adequately inflated and otherwise clear. Cardiomediastinal silhouette is within normal. Bones and soft tissues are unremarkable. IMPRESSION: No active cardiopulmonary disease. Electronically Signed   By: Elberta Fortis M.D.   On: 05/07/2017 13:29    Procedures Procedures (including critical care time)  Medications Ordered in ED Medications  nitroGLYCERIN (NITROSTAT) SL tablet 0.4 mg (0.4 mg Sublingual Given 05/07/17 1813)  pantoprazole (PROTONIX) EC tablet 40 mg (40 mg Oral Given 05/08/17 0914)  enoxaparin (LOVENOX) injection 40 mg (40 mg Subcutaneous Given 05/07/17 2307)  atorvastatin (LIPITOR) tablet 80 mg (not administered)  metoprolol tartrate (LOPRESSOR) tablet 12.5 mg (12.5 mg Oral Given 05/08/17 1008)  aspirin EC tablet 81 mg (not administered)  aspirin chewable tablet 324 mg (324 mg Oral Given 05/07/17 1737)  Initial Impression / Assessment and Plan / ED Course  I have reviewed the triage vital signs and the nursing notes.  Pertinent labs & imaging results that were available during my care of the patient were reviewed by me and considered in my medical decision making (see chart for details).    62 year old male history of anxiety, with 4.4 cm, presents emergency department with 3 months of waxing waning intermittent left-sided chest discomfort described as a dull ache/pressure while at rest at times lasting greater than 15 minutes.  Patient states having a recent CT Noncon of the chest with a calcium score 75 and the incidental finding of the aneurysm.  Patient reports emergency department today with left-sided chest discomfort that is near subsided still residual dull ache.  Patient denies any recent illness/trauma, lower extremity edema or prior PE/DVT.  Patient's baseline functional status is ambulatory working for [employer].  No family history on maternal side for rupture of thoracic aortic aneurysm.  Patient  arrives hemodynamically stable well-appearing.  Physical exam unremarkable.  EKG normal sinus rhythm without findings concerning for ST elevation/T wave inversion/arrhythmia.  Troponin x1-.  Remainder of labs unremarkable for acute findings.  Chest x-ray without findings concerning for cardiac etiology/infectious etiology.  Aspirin given in the emergency department along with nitroglycerin. HEAR score 4.   Admit hospitalist group for chest pain rule out like need nuc med echo stress test.    Final Clinical Impressions(s) / ED Diagnoses   Final diagnoses:  Chest pain, unspecified type    ED Discharge Orders    None       Jaynie Collins, DO 05/08/17 1021    Little, Ambrose Finland, MD 05/08/17 [phone]

## 2017-05-07 NOTE — H&P (Signed)
TRH H&P   Patient Demographics:    Rodney Santos, is a 62 y.o. male  MRN: [id number]   DOB - Apr 04, 1955  Admit Date - 05/07/2017  Outpatient Primary MD for the patient is Patient, No Pcp Per  Referring MD/NP/PA:  [place] Robinson  Outpatient Specialists:     Patient coming from: home  Chief Complaint  Patient presents with  . Chest Pain      HPI:    Rodney Santos  is a 62 y.o. male, w Thoracic aortic aneurysm, lung nodule, apparently c/o chest pain starting a few months ago.  Left sided, cramping.    Yesterday was really bothering him.  Woke up with some dyspnea and chest pain this morning and his wife convinced him to go to the ER. The pain has been fairly constant today.    In ED, pt given nitro.  Nitro seemed to help in the ED. But didn't completely take the pain away.   CXR IMPRESSION: No active cardiopulmonary disease.  Na 136, K 3.7, Bun 12, Creatinine 0.97 Glucose 114 Wbc 6.0, hgb 15.5, Plt 284  Trop I 0.00   EKG nsr at 95, nl int, nl axis, t inverted in v1, flat in v2, no other st-t changes c/w ischemia.     Pt will be admitted for chest pain evaluation.          Review of systems:    In addition to the HPI above,  No Fever-chills, No Headache, No changes with Vision or hearing, No problems swallowing food or Liquids, No Cough,  No Abdominal pain, No Nausea or Vommitting, Bowel movements are regular, No Blood in stool or Urine, No dysuria, No new skin rashes or bruises, No new joints pains-aches,  No new weakness, tingling, numbness in any extremity, No recent weight gain or loss, No polyuria, polydypsia or polyphagia, No significant Mental Stressors.  A full 10 point Review of Systems was done, except as stated above, all other Review of Systems were negative.   With Past History of the following :    Past Medical History:    Diagnosis Date  . Anxiety   . Lung nodule   . Thoracic aortic aneurysm [facility])       Past Surgical History:  Procedure Laterality Date  . HERNIA REPAIR        Social History:     Social History   Tobacco Use  . Smoking status: Never Smoker  . Smokeless tobacco: Never Used  Substance Use Topics  . Alcohol use: Yes     Lives - at home Works at home  Mobility - walks by self   Family History :     Family History  Problem Relation Age of Onset  . Aortic aneurysm Mother   . Hypertension Father       Home Medications:   Prior to Admission medications  Medication Sig Start Date End Date Taking? Authorizing Provider  omeprazole (PRILOSEC) 20 MG capsule Take 20 mg by mouth daily.   Yes [provider]  Turmeric 500 MG TABS Take 1,000 mg by mouth daily.   Yes [provider]  diclofenac (VOLTAREN) 75 MG EC tablet Take 1 tablet (75 mg total) by mouth 2 (two) times daily. 11/07/15   Asencion Islam, DPM  HYDROcodone-acetaminophen (NORCO/VICODIN) 5-325 MG tablet Take 1 tablet by mouth every 4 (four) hours as needed. 02/19/16   Garlon Hatchet, PA-C  methylPREDNISolone (MEDROL DOSEPAK) 4 MG TBPK tablet Take 1st as instructed 11/07/15   Asencion Islam, DPM  sulfamethoxazole-trimethoprim (BACTRIM DS,SEPTRA DS) 800-160 MG tablet Take 1 tablet by mouth 2 (two) times daily. Patient not taking: Reported on 05/07/2017 02/18/16   Rise Mu, PA-C     Allergies:     Allergies  Allergen Reactions  . Penicillins Nausea Only    Has patient had a PCN reaction causing immediate rash, facial/tongue/throat swelling, SOB or lightheadedness with hypotension: No Has patient had a PCN reaction causing severe rash involving mucus membranes or skin necrosis: No Has patient had a PCN reaction that required hospitalization: No Has patient had a PCN reaction occurring within the last 10 years: Yes If all of the above answers are "NO", then may proceed with Cephalosporin  use.     Physical Exam:   Vitals  Blood pressure (!) 114/93, pulse 64, temperature 98 F (36.7 C), temperature source Oral, resp. rate 12, height 5\' 10"  (1.778 m), weight 93.9 kg (207 lb), SpO2 96 %.   1. General  lying in bed in NAD,    2. Normal affect and insight, Not Suicidal or Homicidal, Awake Alert, Oriented X 3.  3. No F.N deficits, ALL C.Nerves Intact, Strength 5/5 all 4 extremities, Sensation intact all 4 extremities, Plantars down going.  4. Ears and Eyes appear Normal, Conjunctivae clear, PERRLA. Moist Oral Mucosa.  5. Supple Neck, No JVD, No cervical lymphadenopathy appriciated, No Carotid Bruits.  6. Symmetrical Chest wall movement, Good air movement bilaterally, CTAB.  7. RRR, No Gallops, Rubs or Murmurs, No Parasternal Heave.  8. Positive Bowel Sounds, Abdomen Soft, No tenderness, No organomegaly appriciated,No rebound -guarding or rigidity.  9.  No Cyanosis, Normal Skin Turgor, No Skin Rash or Bruise.  10. Good muscle tone,  joints appear normal , no effusions, Normal ROM.  11. No Palpable Lymph Nodes in Neck or Axillae     Data Review:    CBC Recent Labs  Lab 05/07/17 1255  WBC 6.0  HGB 15.5  HCT 45.0  PLT 284  MCV 90.2  MCH 31.1  MCHC 34.4  RDW 12.7   ------------------------------------------------------------------------------------------------------------------  Chemistries  Recent Labs  Lab 05/07/17 1255  NA 136  K 3.7  CL 103  CO2 22  GLUCOSE 114*  BUN 12  CREATININE 0.97  CALCIUM 9.0   ------------------------------------------------------------------------------------------------------------------ estimated creatinine clearance is 94.4 mL/min (by C-G formula based on SCr of 0.97 mg/dL). ------------------------------------------------------------------------------------------------------------------ No results for input(s): TSH, T4TOTAL, T3FREE, THYROIDAB in the last 72 hours.  Invalid input(s): FREET3  Coagulation  profile No results for input(s): INR, PROTIME in the last 168 hours. ------------------------------------------------------------------------------------------------------------------- No results for input(s): DDIMER in the last 72 hours. -------------------------------------------------------------------------------------------------------------------  Cardiac Enzymes No results for input(s): CKMB, TROPONINI, MYOGLOBIN in the last 168 hours.  Invalid input(s): CK ------------------------------------------------------------------------------------------------------------------ No results found for: BNP   ---------------------------------------------------------------------------------------------------------------  Urinalysis No results found for: COLORURINE, APPEARANCEUR, LABSPEC, PHURINE, GLUCOSEU,  HGBUR, BILIRUBINUR, KETONESUR, PROTEINUR, UROBILINOGEN, NITRITE, LEUKOCYTESUR  ----------------------------------------------------------------------------------------------------------------   Imaging Results:    Dg Chest 2 View  Result Date: 05/07/2017 CLINICAL DATA:  Left chest pain radiating to back which shortness-of-breath. Thoracic aortic aneurysm diagnosed 3 months ago. Cough. EXAM: CHEST  2 VIEW COMPARISON:  07/16/2010 FINDINGS: Lungs are adequately inflated and otherwise clear. Cardiomediastinal silhouette is within normal. Bones and soft tissues are unremarkable. IMPRESSION: No active cardiopulmonary disease. Electronically Signed   By: Elberta Fortisaniel  Boyle M.D.   On: 05/07/2017 13:29       Assessment & Plan:    Principal Problem:   Chest pain Active Problems:   Aortic aneurysm (HCC)   Lung nodule   Chest pain Tele Trop I q3h x3 Cardiac echo Aspirin 325mg  po qday Lipitor 80mg  po qhs Check lipid panel in am  NPO after [facility] consult place in computer (email) Appreciate input  Thoracic aortic aneurysm Will need outpatient follow up  Lung nodule Repeat CT  scan in 12 months  Gerd Cont protonix  DVT Prophylaxis Lovenox - SCDs  AM Labs Ordered, also please review Full Orders  Family Communication: Admission, patients condition and plan of care including tests being ordered have been discussed with the patient who indicate understanding and agree with the plan and Code Status.  Code Status FULL CODE  Likely DC to  home  Condition GUARDED    Consults called: cardiology by email  Admission status: observation  Time spent in minutes : 45   Pearson GrippeJames Shahab Polhamus M.D on 05/07/2017 at 7:15 PM  Between 7am to 7pm - Pager - [phone]  . After 7pm go to [email or web address] - password [email or web address]  [facility] - Office  [phone]

## 2017-05-07 NOTE — ED Provider Notes (Signed)
Patient placed in Quick Look pathway, seen and evaluated   Chief Complaint: gradually worsening CP x multiple months  HPI:   Pt here with gradually worsening CP for several months. CP is intermittent, related to physical activity, wakes him up at night. Reports assoc fatigue abd intermittent SOB. CT chest done on 04/17/17 with 4.4cm ascending thoracic aneurysm (pt with printed report in hand). Pt reporting today because he has been unable to establish care with cardiologist regarding aneurysm, and chest pain is gradually worsening.  ROS: + chest pain (one)  Physical Exam:   Gen: No distress, well-appearing  Neuro: Awake and Alert  Skin: Warm    Focused Exam: Normal heart sounds, normal rate and rhythm. Lungs CTAB. Bilateral radial pulses strong and equal. BP 146/100   Initiation of care has begun. The patient has been counseled on the process, plan, and necessity for staying for the completion/evaluation, and the remainder of the medical screening examination    Rosa Wyly, [place] N, PA-C 05/07/17 1308    Mancel BaleWentz, Elliott, MD 05/08/17 1120

## 2017-05-07 NOTE — ED Provider Notes (Incomplete)
[facility]   CSN: [id number] Arrival date & time: 05/07/17  1245     History   Chief Complaint Chief Complaint  Patient presents with  . Chest Pain    HPI Rodney Santos is a 62 y.o. male.  HPI  62 year old male history of anxiety, thoracic aortic aneurysm, presents the emergency department with 3 months of intermittent left-sided chest discomfort described as a dull ache to pressure while at rest at times lasting greater than 15 minutes.  Patient states having a recent CT of the heart with a calcium score finding an incidental thoracic aortic aneurysm measuring 4.4 cm along with a calcium score of 75.  Patient reports to the emergency department today with left-sided chest discomfort that is near subsided.  Patient denies any recent illnesses/lower extremity edema.  Patient does endorse some mild shortness of breath associated with this.  Patient's baseline functional status is ambulatory in a high stress environment working for [employer].  Of note family history for maternal side rupture of a thoracic aortic aneurysm.  Past Medical History:  Diagnosis Date  . Anxiety   . Thoracic aortic aneurysm (HCC)     There are no active problems to display for this patient.   Past Surgical History:  Procedure Laterality Date  . HERNIA REPAIR         Home Medications    Prior to Admission medications   Medication Sig Start Date End Date Taking? Authorizing Provider  omeprazole (PRILOSEC) 20 MG capsule Take 20 mg by mouth daily.   Yes [provider]  Turmeric 500 MG TABS Take 1,000 mg by mouth daily.   Yes [provider]  diclofenac (VOLTAREN) 75 MG EC tablet Take 1 tablet (75 mg total) by mouth 2 (two) times daily. 11/07/15   Asencion IslamStover, Titorya, DPM  HYDROcodone-acetaminophen (NORCO/VICODIN) 5-325 MG tablet Take 1 tablet by mouth every 4 (four) hours as needed. 02/19/16   Garlon HatchetSanders, Lisa M, PA-C  methylPREDNISolone  (MEDROL DOSEPAK) 4 MG TBPK tablet Take 1st as instructed 11/07/15   Asencion IslamStover, Titorya, DPM  sulfamethoxazole-trimethoprim (BACTRIM DS,SEPTRA DS) 800-160 MG tablet Take 1 tablet by mouth 2 (two) times daily. Patient not taking: Reported on 05/07/2017 02/18/16   Wallace KellerLeaphart, Kenneth T, PA-C    Family History History reviewed. No pertinent family history.  Social History Social History   Tobacco Use  . Smoking status: Never Smoker  . Smokeless tobacco: Never Used  Substance Use Topics  . Alcohol use: Yes  . Drug use: No     Allergies   Penicillins   Review of Systems Review of Systems .bh  Physical Exam Updated Vital Signs BP 131/88 (BP Location: Left Arm)   Pulse 71   Temp 98 F (36.7 C) (Oral)   Resp 16   SpO2 98%   Physical Exam   ED Treatments / Results  Labs (all labs ordered are listed, but only abnormal results are displayed) Labs Reviewed  BASIC METABOLIC PANEL - Abnormal; Notable for the following components:      Result Value   Glucose, Bld 114 (*)    All other components within normal limits  CBC  I-STAT TROPONIN, ED    EKG  EKG Interpretation  Date/Time:  Thursday May 07 2017 12:49:48 EST Ventricular Rate:  96 PR Interval:  158 QRS Duration: 88 QT Interval:  346 QTC Calculation: 437 R Axis:   -22 Text Interpretation:  Normal sinus rhythm Normal ECG No previous ECGs available  Confirmed by Frederick Peers [phone]) on 05/07/2017 4:39:53 PM       Radiology Dg Chest 2 View  Result Date: 05/07/2017 CLINICAL DATA:  Left chest pain radiating to back which shortness-of-breath. Thoracic aortic aneurysm diagnosed 3 months ago. Cough. EXAM: CHEST  2 VIEW COMPARISON:  07/16/2010 FINDINGS: Lungs are adequately inflated and otherwise clear. Cardiomediastinal silhouette is within normal. Bones and soft tissues are unremarkable. IMPRESSION: No active cardiopulmonary disease. Electronically Signed   By: Elberta Fortis M.D.   On: 05/07/2017 13:29     Procedures Procedures (including critical care time)  Medications Ordered in ED Medications - No data to display   Initial Impression / Assessment and Plan / ED Course  I have reviewed the triage vital signs and the nursing notes.  Pertinent labs & imaging results that were available during my care of the patient were reviewed by me and considered in my medical decision making (see chart for details).     62 year old male history of anxiety, thoracic aortic aneurysm, presents the emergency department with 3 months of intermittent left-sided chest discomfort described as a dull ache to pressure while at rest at times lasting greater than 15 minutes.  Patient states having a recent CT of the heart with a calcium score finding an incidental thoracic aortic aneurysm measuring 4.4 cm along with a calcium score of 75.  Patient reports to the emergency department today with left-sided chest discomfort that is near subsided.  Patient denies any recent illnesses/lower extremity edema.  Patient does endorse some mild shortness of breath associated with this.  Patient's baseline functional status is ambulatory in a high stress environment working for [employer].  Of note family history for maternal side rupture of a thoracic aortic aneurysm.  Outside hospital imaging studies include a CT heart without contrast with calcium showing incidental 4 mm medial left lobe pulmonary nodule; ascending thoracic aortic aneurysm measuring 4.4 cm and a small to moderate amount of coronary calcification present in the left anterior descending artery and left circumflex artery calcium score calculated at 75  Final Clinical Impressions(s) / ED Diagnoses   Final diagnoses:  None    ED Discharge Orders    None

## 2017-05-07 NOTE — ED Notes (Signed)
ED Provider at bedside. 

## 2017-05-07 NOTE — ED Triage Notes (Signed)
Pt endorses left sided CP with radiation to the neck for several weeks with shob and has a 1 year hx of thoracic aortic aneurysm. Pain has been waking pt up at night. VSS

## 2017-05-08 ENCOUNTER — Inpatient Hospital Stay (HOSPITAL_COMMUNITY): Payer: BLUE CROSS/BLUE SHIELD

## 2017-05-08 ENCOUNTER — Encounter (HOSPITAL_COMMUNITY): Payer: Self-pay | Admitting: Cardiology

## 2017-05-08 ENCOUNTER — Other Ambulatory Visit: Payer: Self-pay

## 2017-05-08 ENCOUNTER — Inpatient Hospital Stay (HOSPITAL_COMMUNITY)
Admission: EM | Disposition: A | Discharge status: Home / Self Care | Payer: Self-pay | Source: Home / Self Care | Attending: Family Medicine

## 2017-05-08 DIAGNOSIS — Z791 Long term (current) use of non-steroidal anti-inflammatories (NSAID): Secondary | ICD-10-CM | POA: Diagnosis not present

## 2017-05-08 DIAGNOSIS — R911 Solitary pulmonary nodule: Secondary | ICD-10-CM | POA: Diagnosis present

## 2017-05-08 DIAGNOSIS — I2583 Coronary atherosclerosis due to lipid rich plaque: Secondary | ICD-10-CM | POA: Diagnosis not present

## 2017-05-08 DIAGNOSIS — Z955 Presence of coronary angioplasty implant and graft: Secondary | ICD-10-CM | POA: Diagnosis not present

## 2017-05-08 DIAGNOSIS — E785 Hyperlipidemia, unspecified: Secondary | ICD-10-CM | POA: Diagnosis present

## 2017-05-08 DIAGNOSIS — I2510 Atherosclerotic heart disease of native coronary artery without angina pectoris: Secondary | ICD-10-CM | POA: Diagnosis not present

## 2017-05-08 DIAGNOSIS — R079 Chest pain, unspecified: Secondary | ICD-10-CM | POA: Diagnosis not present

## 2017-05-08 DIAGNOSIS — R402413 Glasgow coma scale score 13-15, at hospital admission: Secondary | ICD-10-CM | POA: Diagnosis present

## 2017-05-08 DIAGNOSIS — Z79899 Other long term (current) drug therapy: Secondary | ICD-10-CM | POA: Diagnosis not present

## 2017-05-08 DIAGNOSIS — I25110 Atherosclerotic heart disease of native coronary artery with unstable angina pectoris: Principal | ICD-10-CM

## 2017-05-08 DIAGNOSIS — Z8249 Family history of ischemic heart disease and other diseases of the circulatory system: Secondary | ICD-10-CM | POA: Diagnosis not present

## 2017-05-08 DIAGNOSIS — I200 Unstable angina: Secondary | ICD-10-CM

## 2017-05-08 DIAGNOSIS — Z880 Allergy status to penicillin: Secondary | ICD-10-CM | POA: Diagnosis not present

## 2017-05-08 DIAGNOSIS — K219 Gastro-esophageal reflux disease without esophagitis: Secondary | ICD-10-CM | POA: Diagnosis present

## 2017-05-08 DIAGNOSIS — I712 Thoracic aortic aneurysm, without rupture: Secondary | ICD-10-CM | POA: Diagnosis present

## 2017-05-08 DIAGNOSIS — I25118 Atherosclerotic heart disease of native coronary artery with other forms of angina pectoris: Secondary | ICD-10-CM | POA: Diagnosis not present

## 2017-05-08 HISTORY — PX: THORACIC AORTOGRAM: CATH118269

## 2017-05-08 HISTORY — PX: LEFT HEART CATH AND CORONARY ANGIOGRAPHY: CATH118249

## 2017-05-08 HISTORY — PX: CORONARY STENT INTERVENTION: CATH118234

## 2017-05-08 LAB — COMPREHENSIVE METABOLIC PANEL
ALT: 26 U/L (ref 17–63)
AST: 24 U/L (ref 15–41)
Albumin: 3.7 g/dL (ref 3.5–5.0)
Alkaline Phosphatase: 60 U/L (ref 38–126)
Anion gap: 11 (ref 5–15)
BUN: 11 mg/dL (ref 6–20)
CO2: 23 mmol/L (ref 22–32)
Calcium: 8.7 mg/dL — ABNORMAL LOW (ref 8.9–10.3)
Chloride: 106 mmol/L (ref 101–111)
Creatinine, Ser: 0.9 mg/dL (ref 0.61–1.24)
GFR calc Af Amer: >60 mL/min (ref >60)
GFR calc non Af Amer: >60 mL/min (ref >60)
Glucose, Bld: 97 mg/dL (ref 65–99)
Potassium: 3.8 mmol/L (ref 3.5–5.1)
Sodium: 140 mmol/L (ref 135–145)
Total Bilirubin: 1.4 mg/dL — ABNORMAL HIGH (ref 0.3–1.2)
Total Protein: 6.3 g/dL — ABNORMAL LOW (ref 6.5–8.1)

## 2017-05-08 LAB — ECHOCARDIOGRAM COMPLETE
E decel time: 218 msec
E/e' ratio: 9.08
FS: 34 % (ref 28–44)
Height: 70 in
IVS/LV PW RATIO, ED: 0.73
LA ID, A-P, ES: 31 mm
LA diam end sys: 31 mm
LA diam index: 1.46 cm/m2
LA vol A4C: 26.9 ml
LA vol index: 16.6 mL/m2
LA vol: 35.1 mL
LV E/e' medial: 9.08
LV E/e'average: 9.08
LV PW d: 11 mm — AB (ref 0.6–1.1)
LV dias vol index: 36 mL/m2
LV dias vol: 77 mL (ref 62–150)
LV e' LATERAL: 8.49 cm/s
LV sys vol index: 20 mL/m2
LV sys vol: 42 mL (ref 21–61)
LVOT SV: 73 mL
LVOT VTI: 21.1 cm
LVOT area: 3.46 cm2
LVOT diameter: 21 mm
LVOT peak grad rest: 4 mmHg
LVOT peak vel: 100 cm/s
Lateral S' vel: 10.4 cm/s
MV Dec: 218
MV Peak grad: 2 mmHg
MV pk A vel: 75.2 m/s
MV pk E vel: 77.1 m/s
RV sys press: 20 mmHg
Reg peak vel: 209 cm/s
Simpson's disk: 46
Stroke v: 35 ml
TAPSE: 23.5 mm
TDI e' lateral: 8.49
TDI e' medial: 7.94
TR max vel: 209 cm/s
Weight: 3307.2 oz

## 2017-05-08 LAB — CBC
HCT: 43.4 % (ref 39.0–52.0)
Hemoglobin: 14.7 g/dL (ref 13.0–17.0)
MCH: 31.1 pg (ref 26.0–34.0)
MCHC: 33.9 g/dL (ref 30.0–36.0)
MCV: 91.9 fL (ref 78.0–100.0)
Platelets: 270 10*3/uL (ref 150–400)
RBC: 4.72 MIL/uL (ref 4.22–5.81)
RDW: 13.1 % (ref 11.5–15.5)
WBC: 4.7 10*3/uL (ref 4.0–10.5)

## 2017-05-08 LAB — MRSA PCR SCREENING: MRSA by PCR: NEGATIVE

## 2017-05-08 LAB — HIV ANTIBODY (ROUTINE TESTING W REFLEX): HIV Screen 4th Generation wRfx: NONREACTIVE

## 2017-05-08 LAB — LIPID PANEL
Cholesterol: 212 mg/dL — ABNORMAL HIGH (ref 0–200)
HDL: 26 mg/dL — ABNORMAL LOW (ref >40)
LDL Cholesterol: 129 mg/dL — ABNORMAL HIGH (ref 0–99)
Total CHOL/HDL Ratio: 8.2 RATIO
Triglycerides: 287 mg/dL — ABNORMAL HIGH (ref <150)
VLDL: 57 mg/dL — ABNORMAL HIGH (ref 0–40)

## 2017-05-08 LAB — POCT ACTIVATED CLOTTING TIME: Activated Clotting Time: 356 seconds

## 2017-05-08 LAB — PROTIME-INR
INR: 1.11
Prothrombin Time: 14.2 seconds (ref 11.4–15.2)

## 2017-05-08 SURGERY — LEFT HEART CATH AND CORONARY ANGIOGRAPHY
Anesthesia: LOCAL

## 2017-05-08 MED ORDER — ZOLPIDEM TARTRATE 5 MG PO TABS
5.0000 mg | ORAL_TABLET | Freq: Every evening | ORAL | Status: DC | PRN
Start: 2017-05-08 — End: 2017-05-09

## 2017-05-08 MED ORDER — SODIUM CHLORIDE 0.9 % IV SOLN
INTRAVENOUS | Status: AC
  Administered 2017-05-08: 14:00:00 via INTRAVENOUS

## 2017-05-08 MED ORDER — FENTANYL CITRATE (PF) 100 MCG/2ML IJ SOLN
INTRAMUSCULAR | Status: DC | PRN
  Administered 2017-05-08: 25 ug via INTRAVENOUS

## 2017-05-08 MED ORDER — VERAPAMIL HCL 2.5 MG/ML IV SOLN
INTRAVENOUS | Status: AC
  Filled 2017-05-08: qty 2

## 2017-05-08 MED ORDER — SODIUM CHLORIDE 0.9% FLUSH: 3.0000 mL | INTRAVENOUS | Status: DC | PRN

## 2017-05-08 MED ORDER — HEPARIN (PORCINE) IN NACL 2-0.9 UNIT/ML-% IJ SOLN
INTRAMUSCULAR | Status: AC
  Filled 2017-05-08: qty 1000

## 2017-05-08 MED ORDER — HEPARIN SODIUM (PORCINE) 1000 UNIT/ML IJ SOLN
INTRAMUSCULAR | Status: DC | PRN
  Administered 2017-05-08: 6000 [IU] via INTRAVENOUS
  Administered 2017-05-08: 4500 [IU] via INTRAVENOUS

## 2017-05-08 MED ORDER — LIDOCAINE HCL (PF) 1 % IJ SOLN
INTRAMUSCULAR | Status: AC
  Filled 2017-05-08: qty 30

## 2017-05-08 MED ORDER — CLOPIDOGREL BISULFATE 75 MG PO TABS
75.0000 mg | ORAL_TABLET | Freq: Every day | ORAL | Status: DC
  Administered 2017-05-09: 09:00:00 75 mg via ORAL
  Filled 2017-05-08: qty 1

## 2017-05-08 MED ORDER — VERAPAMIL HCL 2.5 MG/ML IV SOLN
INTRAVENOUS | Status: DC | PRN
  Administered 2017-05-08: 10 mL via INTRA_ARTERIAL

## 2017-05-08 MED ORDER — HEPARIN (PORCINE) IN NACL 2-0.9 UNIT/ML-% IJ SOLN
INTRAMUSCULAR | Status: AC | PRN
  Administered 2017-05-08 (×2): 500 mL

## 2017-05-08 MED ORDER — ASPIRIN EC 81 MG PO TBEC
81.0000 mg | DELAYED_RELEASE_TABLET | Freq: Every day | ORAL | Status: DC
  Administered 2017-05-09: 09:00:00 81 mg via ORAL
  Filled 2017-05-08: qty 1

## 2017-05-08 MED ORDER — IOPAMIDOL (ISOVUE-370) INJECTION 76%
INTRAVENOUS | Status: DC | PRN
  Administered 2017-05-08: 260 mL

## 2017-05-08 MED ORDER — CLOPIDOGREL BISULFATE 300 MG PO TABS
ORAL_TABLET | ORAL | Status: AC
  Filled 2017-05-08: qty 2

## 2017-05-08 MED ORDER — IOPAMIDOL (ISOVUE-370) INJECTION 76%
INTRAVENOUS | Status: AC
  Filled 2017-05-08: qty 100

## 2017-05-08 MED ORDER — METOPROLOL TARTRATE 12.5 MG HALF TABLET
12.5000 mg | ORAL_TABLET | Freq: Two times a day (BID) | ORAL | Status: DC
  Administered 2017-05-08 – 2017-05-09 (×2): 12.5 mg via ORAL
  Filled 2017-05-08 (×3): qty 1

## 2017-05-08 MED ORDER — MIDAZOLAM HCL 2 MG/2ML IJ SOLN
INTRAMUSCULAR | Status: AC
  Filled 2017-05-08: qty 2

## 2017-05-08 MED ORDER — SODIUM CHLORIDE 0.9% FLUSH
3.0000 mL | Freq: Two times a day (BID) | INTRAVENOUS | Status: DC
Start: 2017-05-08 — End: 2017-05-09

## 2017-05-08 MED ORDER — LABETALOL HCL 5 MG/ML IV SOLN: 10.0000 mg | INTRAVENOUS | Status: AC | PRN

## 2017-05-08 MED ORDER — NITROGLYCERIN 1 MG/10 ML FOR IR/CATH LAB
INTRA_ARTERIAL | Status: DC | PRN
  Administered 2017-05-08: 200 ug via INTRACORONARY

## 2017-05-08 MED ORDER — ONDANSETRON HCL 4 MG/2ML IJ SOLN: 4.0000 mg | Freq: Four times a day (QID) | INTRAMUSCULAR | Status: DC | PRN

## 2017-05-08 MED ORDER — HEPARIN SODIUM (PORCINE) 1000 UNIT/ML IJ SOLN
INTRAMUSCULAR | Status: AC
  Filled 2017-05-08: qty 1

## 2017-05-08 MED ORDER — CLOPIDOGREL BISULFATE 300 MG PO TABS
ORAL_TABLET | ORAL | Status: DC | PRN
  Administered 2017-05-08: 600 mg via ORAL

## 2017-05-08 MED ORDER — SODIUM CHLORIDE 0.9 % IV SOLN
INTRAVENOUS | Status: DC
  Administered 2017-05-08: 12:00:00 via INTRAVENOUS

## 2017-05-08 MED ORDER — SODIUM CHLORIDE 0.9 % IV SOLN: 250.0000 mL | INTRAVENOUS | Status: DC | PRN

## 2017-05-08 MED ORDER — MIDAZOLAM HCL 2 MG/2ML IJ SOLN
INTRAMUSCULAR | Status: DC | PRN
  Administered 2017-05-08: 2 mg via INTRAVENOUS

## 2017-05-08 MED ORDER — NITROGLYCERIN 1 MG/10 ML FOR IR/CATH LAB
INTRA_ARTERIAL | Status: AC
  Filled 2017-05-08: qty 10

## 2017-05-08 MED ORDER — ALPRAZOLAM 0.25 MG PO TABS: 0.2500 mg | ORAL_TABLET | Freq: Two times a day (BID) | ORAL | Status: DC | PRN

## 2017-05-08 MED ORDER — ACETAMINOPHEN 325 MG PO TABS
650.0000 mg | ORAL_TABLET | ORAL | Status: DC | PRN
  Administered 2017-05-08: 650 mg via ORAL
  Filled 2017-05-08: qty 2

## 2017-05-08 MED ORDER — HYDRALAZINE HCL 20 MG/ML IJ SOLN: 5.0000 mg | INTRAMUSCULAR | Status: AC | PRN

## 2017-05-08 MED ORDER — FENTANYL CITRATE (PF) 100 MCG/2ML IJ SOLN
INTRAMUSCULAR | Status: AC
  Filled 2017-05-08: qty 2

## 2017-05-08 MED ORDER — LIDOCAINE HCL (PF) 1 % IJ SOLN
INTRAMUSCULAR | Status: DC | PRN
  Administered 2017-05-08: 2 mL

## 2017-05-08 SURGICAL SUPPLY — 21 items
BALLN SAPPHIRE 2.0X15 (BALLOONS) ×2
BALLN SAPPHIRE ~~LOC~~ 2.5X12 (BALLOONS) ×2 IMPLANT
BALLN ~~LOC~~ EMERGE MR 2.25X15 (BALLOONS) ×2
BALLOON SAPPHIRE 2.0X15 (BALLOONS) ×1 IMPLANT
BALLOON ~~LOC~~ EMERGE MR 2.25X15 (BALLOONS) ×1 IMPLANT
CATH 5FR JL3.5 JR4 ANG PIG MP (CATHETERS) ×2 IMPLANT
CATH LAUNCHER 6FR EBU3.5 (CATHETERS) ×2 IMPLANT
DEVICE RAD COMP TR BAND LRG (VASCULAR PRODUCTS) ×2 IMPLANT
GLIDESHEATH SLEND SS 6F .021 (SHEATH) ×2 IMPLANT
GUIDEWIRE INQWIRE 1.5J.035X260 (WIRE) ×1 IMPLANT
INQWIRE 1.5J .035X260CM (WIRE) ×2
KIT ENCORE 26 ADVANTAGE (KITS) ×2 IMPLANT
KIT HEART LEFT (KITS) ×2 IMPLANT
KIT PV (KITS) ×2 IMPLANT
PACK CARDIAC CATHETERIZATION (CUSTOM PROCEDURE TRAY) ×2 IMPLANT
STENT SYNERGY DES 2.25X38 (Permanent Stent) ×2 IMPLANT
SYR MEDRAD MARK V 150ML (SYRINGE) ×2 IMPLANT
TRANSDUCER W/STOPCOCK (MISCELLANEOUS) ×2 IMPLANT
TRAY PV CATH (CUSTOM PROCEDURE TRAY) ×2 IMPLANT
TUBING CIL FLEX 10 FLL-RA (TUBING) ×2 IMPLANT
WIRE COUGAR XT STRL 190CM (WIRE) ×2 IMPLANT

## 2017-05-08 NOTE — Progress Notes (Incomplete)
Patient arrived [address] [place]`

## 2017-05-08 NOTE — H&P (View-Only) (Signed)
Cardiology Consultation:   Patient ID: Rodney Santos; [id number]; 28-Nov-1955   Admit date: 05/07/2017 Date of Consult: 05/08/2017  Primary Care Provider: Patient, No Pcp Per Primary Cardiologist: Dr Luvenia HellerNahser-new   Patient Profile:   Rodney Santos is a 62 y.o. male with a hx of ascending thoracic aneurysm who is being seen today for the evaluation of chest pain at the request of Dr Selena BattenKim.  History of Present Illness:   Rodney Santos is a married, non smoking, 62 y/o male, works at [employer], who presented to the ED 05/07/17 with complaints of gradually increasing SSCP.  The [place] [facility] [place] [facility] ([place] while visiting there. This showed small to moderate amount of CAD in the LAD and CFX with a Ca++ score of 74. He also had 4.4 cm ascending thoracic aneurysm, and a LLL pulmonary nodule. He had the screening last year and it was normal. He was going to pass on it this year but he has been noticing SSCP and tightness at work for the past few months and he decided to go ahead with it. He denies any associated neck, jaw, or arm pain. No associated diaphoresis or nausea. He does think he has a sensation of SOB associated with his chest pain. His chest discomfort is relieved with rest and aggravated by physical exertion. He mentioned it to his wife who encouraged him to come to the ED for further evaluation. He has had no prior cardiac work up and has no PCP. His EKG is normal, Troponin negative x 3.    Past Medical History:  Diagnosis Date  . Anxiety   . CAD (coronary artery disease) 04/2017   noted on CT scan [facility])  . Dyslipidemia 04/2017   LDL 129  . Lung nodule 04/2017   4 mm LLL  . Thoracic aortic aneurysm (HCC) 04/2017   4.4 cm by CT [facility])    Past Surgical History:  Procedure Laterality Date  . HERNIA REPAIR       Home Medications:  Prior to Admission medications   Medication Sig Start Date End Date Taking? Authorizing Provider    omeprazole (PRILOSEC) 20 MG capsule Take 20 mg by mouth daily.   Yes [provider]  Turmeric 500 MG TABS Take 1,000 mg by mouth daily.   Yes [provider]  diclofenac (VOLTAREN) 75 MG EC tablet Take 1 tablet (75 mg total) by mouth 2 (two) times daily. 11/07/15   Asencion IslamStover, Titorya, DPM  HYDROcodone-acetaminophen (NORCO/VICODIN) 5-325 MG tablet Take 1 tablet by mouth every 4 (four) hours as needed. 02/19/16   Garlon HatchetSanders, Lisa M, PA-C  methylPREDNISolone (MEDROL DOSEPAK) 4 MG TBPK tablet Take 1st as instructed 11/07/15   Asencion IslamStover, Titorya, DPM  sulfamethoxazole-trimethoprim (BACTRIM DS,SEPTRA DS) 800-160 MG tablet Take 1 tablet by mouth 2 (two) times daily. Patient not taking: Reported on 05/07/2017 02/18/16   Rise MuLeaphart, Kenneth T, PA-C    Inpatient Medications: Scheduled Meds: . aspirin EC  325 mg Oral Daily  . atorvastatin  80 mg Oral q1800  . enoxaparin (LOVENOX) injection  40 mg Subcutaneous Q24H  . pantoprazole  40 mg Oral Daily   Continuous Infusions:  PRN Meds: nitroGLYCERIN  Allergies:    Allergies  Allergen Reactions  . Penicillins Nausea Only    Has patient had a PCN reaction causing immediate rash, facial/tongue/throat swelling, SOB or lightheadedness with hypotension: No Has patient had a PCN reaction causing severe rash involving mucus membranes or skin  necrosis: No Has patient had a PCN reaction that required hospitalization: No Has patient had a PCN reaction occurring within the last 10 years: Yes If all of the above answers are "NO", then may proceed with Cephalosporin use.    Social History:   Social History   Socioeconomic History  . Marital status: Divorced    Spouse name: Not on file  . Number of children: Not on file  . Years of education: Not on file  . Highest education level: Not on file  Social Needs  . Financial resource strain: Not on file  . Food insecurity - worry: Not on file  . Food insecurity - inability: Not on file  .  Transportation needs - medical: Not on file  . Transportation needs - non-medical: Not on file  Occupational History  . Not on file  Tobacco Use  . Smoking status: Never Smoker  . Smokeless tobacco: Never Used  Substance and Sexual Activity  . Alcohol use: Yes  . Drug use: No  . Sexual activity: Not on file  Other Topics Concern  . Not on file  Social History Narrative  . Not on file    Family History:    Family History  Problem Relation Age of Onset  . Aortic aneurysm Mother   . Hypertension Father      ROS:  Please see the history of present illness.  All other ROS reviewed and negative.     Physical Exam/Data:   Vitals:   05/07/17 2037 05/08/17 0032 05/08/17 0502 05/08/17 0700  BP: (!) 125/92 103/71 110/82 114/86  Pulse: 69 66 67   Resp: 13 18 18    Temp: 98.1 F (36.7 C)  98.4 F (36.9 C) 98 F (36.7 C)  TempSrc: Oral  Oral Oral  SpO2: 96% 96% 95%   Weight:   206 lb 11.2 oz (93.8 kg)   Height:       No intake or output data in the 24 hours ending 05/08/17 0920 Filed Weights   05/07/17 1705 05/07/17 2035 05/08/17 0502  Weight: 207 lb (93.9 kg) 206 lb 14.4 oz (93.8 kg) 206 lb 11.2 oz (93.8 kg)   Body mass index is 29.66 kg/m.  General:  Well nourished, well developed, in no acute distress HEENT: normal Lymph: no adenopathy Neck: no JVD Endocrine:  No thryomegaly Vascular: No carotid bruits; FA pulses 2+ bilaterally without bruits  Cardiac:  normal S1, S2; RRR; no murmur  Lungs:  clear to auscultation bilaterally, no wheezing, rhonchi or rales  Abd: soft, nontender, no hepatomegaly  Ext: no edema Musculoskeletal:  No deformities, BUE and BLE strength normal and equal Skin: warm and dry  Neuro:  CNs 2-12 intact, no focal abnormalities noted Psych:  Normal affect   EKG:  The EKG was personally reviewed and demonstrates:  NSR Telemetry:  Telemetry was personally reviewed and demonstrates:  NS   Laboratory Data:  Chemistry Recent Labs  Lab  05/07/17 1255 05/08/17 0503  NA 136 140  K 3.7 3.8  CL 103 106  CO2 22 23  GLUCOSE 114* 97  BUN 12 11  CREATININE 0.97 0.90  CALCIUM 9.0 8.7*  GFRNONAA >60 >60  GFRAA >60 >60  ANIONGAP 11 11    Recent Labs  Lab 05/08/17 0503  PROT 6.3*  ALBUMIN 3.7  AST 24  ALT 26  ALKPHOS 60  BILITOT 1.4*   Hematology Recent Labs  Lab 05/07/17 1255 05/08/17 0503  WBC 6.0 4.7  RBC 4.99 4.72  HGB 15.5 14.7  HCT 45.0 43.4  MCV 90.2 91.9  MCH 31.1 31.1  MCHC 34.4 33.9  RDW 12.7 13.1  PLT 284 270   Cardiac EnzymesNo results for input(s): TROPONINI in the last 168 hours.  Recent Labs  Lab 05/07/17 1329 05/07/17 1741 05/07/17 2020  TROPIPOC 0.00 0.00 0.00    BNPNo results for input(s): BNP, PROBNP in the last 168 hours.  DDimer No results for input(s): DDIMER in the last 168 hours.  Radiology/Studies:  Dg Chest 2 View  Result Date: 05/07/2017 CLINICAL DATA:  Left chest pain radiating to back which shortness-of-breath. Thoracic aortic aneurysm diagnosed 3 months ago. Cough. EXAM: CHEST  2 VIEW COMPARISON:  07/16/2010 FINDINGS: Lungs are adequately inflated and otherwise clear. Cardiomediastinal silhouette is within normal. Bones and soft tissues are unremarkable. IMPRESSION: No active cardiopulmonary disease. Electronically Signed   By: Elberta Fortis M.D.   On: 05/07/2017 13:29    Assessment and Plan:   Chest pain with a moderate risk of ACS MI r/o by Troponin  CAD Noted on CT scan 04/17/17  Ascending thoracic aneurysm 4.4 cm by CT scan  Dyslipidemia Untreated LDL 129  Plan: Will review with MD. May be best to proceed with diagnostic angiogram for further evaluation. Add statin, ASA, and low dose beta blocker.   For questions or updates, please contact [facility] Please consult [email or web address] for contact info under Cardiology/STEMI.   Jolene Provost, PA-C  05/08/2017 9:20 AM   Attending Note:   The patient was seen and examined.  Agree with assessment  and plan as noted above.  Changes made to the above note as needed.  Patient seen and independently examined with Corine Shelter, PA .   We discussed all aspects of the encounter. I agree with the assessment and plan as stated above.  1.  Chest discomfort: The patient presents with episodes of chest discomfort.  These seem to be related to exertion.  His job is quite busy.  He is a Games developer at [employer].  These episodes of pain are described as a very sharp pain followed by several minutes of a dull aching or heaviness.  He had a [facility] screening recently that revealed a coronary calcium score of 75.   He was also found to have a very small dilatation of the ascending aorta.  We have started him on metoprolol 12.5 mg twice a day.  Given these results, I think that we should proceed with cardiac catheterization.  We will plan on doing a left heart cath with possible PCI and also an aortogram.  2.  Hyperlipidemia: His LDL cholesterol is 129.  He has been started on atorvastatin 80 mg a day.   I have spent a total of 40 minutes with patient reviewing hospital  notes , telemetry, EKGs, labs and examining patient as well as establishing an assessment and plan that was discussed with the patient. > 50% of time was spent in direct patient care.    Vesta Mixer, Montez Hageman., MD, [facility] 05/08/2017, 9:40 AM [address],  [address] Office [phone] Pager [phone]

## 2017-05-08 NOTE — Consult Note (Addendum)
Cardiology Consultation:   Patient ID: Rodney Santos; [id number]; 02/28/1956   Admit date: 05/07/2017 Date of Consult: 05/08/2017  Primary Care Provider: Patient, No Pcp Per Primary Cardiologist: Dr Helayne Metsker-new   Patient Profile:   Rodney Santos is a 61 y.o. male with a hx of ascending thoracic aneurysm who is being seen today for the evaluation of chest pain at the request of Dr Kim.  History of Present Illness:   Rodney Santos is a married, non smoking, 61 y/o male, works at [employer], who presented to the ED 05/07/17 with complaints of gradually increasing SSCP.  The pt recently had a screening CT evaluation in [place] (04/17/17) while visiting there. This showed small to moderate amount of CAD in the LAD and CFX with a Ca++ score of 74. He also had 4.4 cm ascending thoracic aneurysm, and a LLL pulmonary nodule. He had the screening last year and it was normal. He was going to pass on it this year but he has been noticing SSCP and tightness at work for the past few months and he decided to go ahead with it. He denies any associated neck, jaw, or arm pain. No associated diaphoresis or nausea. He does think he has a sensation of SOB associated with his chest pain. His chest discomfort is relieved with rest and aggravated by physical exertion. He mentioned it to his wife who encouraged him to come to the ED for further evaluation. He has had no prior cardiac work up and has no PCP. His EKG is normal, Troponin negative x 3.    Past Medical History:  Diagnosis Date  . Anxiety   . CAD (coronary artery disease) 04/2017   noted on CT scan ([facility])  . Dyslipidemia 04/2017   LDL 129  . Lung nodule 04/2017   4 mm LLL  . Thoracic aortic aneurysm (HCC) 04/2017   4.4 cm by CT ([facility])    Past Surgical History:  Procedure Laterality Date  . HERNIA REPAIR       Home Medications:  Prior to Admission medications   Medication Sig Start Date End Date Taking? Authorizing Provider    omeprazole (PRILOSEC) 20 MG capsule Take 20 mg by mouth daily.   Yes [provider]  Turmeric 500 MG TABS Take 1,000 mg by mouth daily.   Yes [provider]  diclofenac (VOLTAREN) 75 MG EC tablet Take 1 tablet (75 mg total) by mouth 2 (two) times daily. 11/07/15   Stover, Titorya, DPM  HYDROcodone-acetaminophen (NORCO/VICODIN) 5-325 MG tablet Take 1 tablet by mouth every 4 (four) hours as needed. 02/19/16   Sanders, Lisa M, PA-C  methylPREDNISolone (MEDROL DOSEPAK) 4 MG TBPK tablet Take 1st as instructed 11/07/15   Stover, Titorya, DPM  sulfamethoxazole-trimethoprim (BACTRIM DS,SEPTRA DS) 800-160 MG tablet Take 1 tablet by mouth 2 (two) times daily. Patient not taking: Reported on 05/07/2017 02/18/16   Leaphart, Kenneth T, PA-C    Inpatient Medications: Scheduled Meds: . aspirin EC  325 mg Oral Daily  . atorvastatin  80 mg Oral q1800  . enoxaparin (LOVENOX) injection  40 mg Subcutaneous Q24H  . pantoprazole  40 mg Oral Daily   Continuous Infusions:  PRN Meds: nitroGLYCERIN  Allergies:    Allergies  Allergen Reactions  . Penicillins Nausea Only    Has patient had a PCN reaction causing immediate rash, facial/tongue/throat swelling, SOB or lightheadedness with hypotension: No Has patient had a PCN reaction causing severe rash involving mucus membranes or skin   necrosis: No Has patient had a PCN reaction that required hospitalization: No Has patient had a PCN reaction occurring within the last 10 years: Yes If all of the above answers are "NO", then may proceed with Cephalosporin use.    Social History:   Social History   Socioeconomic History  . Marital status: Divorced    Spouse name: Not on file  . Number of children: Not on file  . Years of education: Not on file  . Highest education level: Not on file  Social Needs  . Financial resource strain: Not on file  . Food insecurity - worry: Not on file  . Food insecurity - inability: Not on file  .  Transportation needs - medical: Not on file  . Transportation needs - non-medical: Not on file  Occupational History  . Not on file  Tobacco Use  . Smoking status: Never Smoker  . Smokeless tobacco: Never Used  Substance and Sexual Activity  . Alcohol use: Yes  . Drug use: No  . Sexual activity: Not on file  Other Topics Concern  . Not on file  Social History Narrative  . Not on file    Family History:    Family History  Problem Relation Age of Onset  . Aortic aneurysm Mother   . Hypertension Father      ROS:  Please see the history of present illness.  All other ROS reviewed and negative.     Physical Exam/Data:   Vitals:   05/07/17 2037 05/08/17 0032 05/08/17 0502 05/08/17 0700  BP: (!) 125/92 103/71 110/82 114/86  Pulse: 69 66 67   Resp: 13 18 18   Temp: 98.1 F (36.7 C)  98.4 F (36.9 C) 98 F (36.7 C)  TempSrc: Oral  Oral Oral  SpO2: 96% 96% 95%   Weight:   206 lb 11.2 oz (93.8 kg)   Height:       No intake or output data in the 24 hours ending 05/08/17 0920 Filed Weights   05/07/17 1705 05/07/17 2035 05/08/17 0502  Weight: 207 lb (93.9 kg) 206 lb 14.4 oz (93.8 kg) 206 lb 11.2 oz (93.8 kg)   Body mass index is 29.66 kg/m.  General:  Well nourished, well developed, in no acute distress HEENT: normal Lymph: no adenopathy Neck: no JVD Endocrine:  No thryomegaly Vascular: No carotid bruits; FA pulses 2+ bilaterally without bruits  Cardiac:  normal S1, S2; RRR; no murmur  Lungs:  clear to auscultation bilaterally, no wheezing, rhonchi or rales  Abd: soft, nontender, no hepatomegaly  Ext: no edema Musculoskeletal:  No deformities, BUE and BLE strength normal and equal Skin: warm and dry  Neuro:  CNs 2-12 intact, no focal abnormalities noted Psych:  Normal affect   EKG:  The EKG was personally reviewed and demonstrates:  NSR Telemetry:  Telemetry was personally reviewed and demonstrates:  NS   Laboratory Data:  Chemistry Recent Labs  Lab  05/07/17 1255 05/08/17 0503  NA 136 140  K 3.7 3.8  CL 103 106  CO2 22 23  GLUCOSE 114* 97  BUN 12 11  CREATININE 0.97 0.90  CALCIUM 9.0 8.7*  GFRNONAA >60 >60  GFRAA >60 >60  ANIONGAP 11 11    Recent Labs  Lab 05/08/17 0503  PROT 6.3*  ALBUMIN 3.7  AST 24  ALT 26  ALKPHOS 60  BILITOT 1.4*   Hematology Recent Labs  Lab 05/07/17 1255 05/08/17 0503  WBC 6.0 4.7  RBC 4.99 4.72    HGB 15.5 14.7  HCT 45.0 43.4  MCV 90.2 91.9  MCH 31.1 31.1  MCHC 34.4 33.9  RDW 12.7 13.1  PLT 284 270   Cardiac EnzymesNo results for input(s): TROPONINI in the last 168 hours.  Recent Labs  Lab 05/07/17 1329 05/07/17 1741 05/07/17 2020  TROPIPOC 0.00 0.00 0.00    BNPNo results for input(s): BNP, PROBNP in the last 168 hours.  DDimer No results for input(s): DDIMER in the last 168 hours.  Radiology/Studies:  Dg Chest 2 View  Result Date: 05/07/2017 CLINICAL DATA:  Left chest pain radiating to back which shortness-of-breath. Thoracic aortic aneurysm diagnosed 3 months ago. Cough. EXAM: CHEST  2 VIEW COMPARISON:  07/16/2010 FINDINGS: Lungs are adequately inflated and otherwise clear. Cardiomediastinal silhouette is within normal. Bones and soft tissues are unremarkable. IMPRESSION: No active cardiopulmonary disease. Electronically Signed   By: Daniel  Boyle M.D.   On: 05/07/2017 13:29    Assessment and Plan:   Chest pain with a moderate risk of ACS MI r/o by Troponin  CAD Noted on CT scan 04/17/17  Ascending thoracic aneurysm 4.4 cm by CT scan  Dyslipidemia Untreated LDL 129  Plan: Will review with MD. May be best to proceed with diagnostic angiogram for further evaluation. Add statin, ASA, and low dose beta blocker.   For questions or updates, please contact [facility] Please consult [email or web address] for contact info under Cardiology/STEMI.   Signed, Luke Kilroy, PA-C  05/08/2017 9:20 AM   Attending Note:   The patient was seen and examined.  Agree with assessment  and plan as noted above.  Changes made to the above note as needed.  Patient seen and independently examined with Luke Kilroy, PA .   We discussed all aspects of the encounter. I agree with the assessment and plan as stated above.  1.  Chest discomfort: The patient presents with episodes of chest discomfort.  These seem to be related to exertion.  His job is quite busy.  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  These episodes of pain are described as a very sharp pain followed by several minutes of a dull aching or heaviness.  He had a [facility] screening recently that revealed a coronary calcium score of 75.   He was also found to have a very small dilatation of the ascending aorta.  We have started him on metoprolol 12.5 mg twice a day.  Given these results, I think that we should proceed with cardiac catheterization.  We will plan on doing a left heart cath with possible PCI and also an aortogram.  2.  Hyperlipidemia: His LDL cholesterol is 129.  He has been started on atorvastatin 80 mg a day.   I have spent a total of 40 minutes with patient reviewing hospital  notes , telemetry, EKGs, labs and examining patient as well as establishing an assessment and plan that was discussed with the patient. > 50% of time was spent in direct patient care.    Javin Nong J. Anhthu Perdew, Jr., MD, FACC 05/08/2017, 9:40 AM [address],  [address] Office - [phone] Pager [phone]    

## 2017-05-08 NOTE — Progress Notes (Signed)
[facility] PROGRESS NOTE  Rodney Santos ZOX:[id number] DOB: 06/30/1955 DOA: 05/07/2017 PCP: Patient, No Pcp Per  Assessment/Plan:  Chest pain Tele Trop I q3h x3 Cardiac echo done Cardiac cath 2/22--> pending results Aspirin 325mg  po qday Lipitor 80mg  po qhs Check lipid panel--> elevated so will discuss tx options with pt  Thoracic aortic aneurysm Will need outpatient follow up  Lung nodule Repeat CT scan in 12 months  Gerd Cont protonix  DVT Prophylaxis Lovenox - SCDs    Code Status: FC Family Communication: none at bedside Disposition Plan: TBD   Consultants:  cardio  Procedures:  Heart cath 2/22  Antibiotics:  na  HPI/Subjective: No issues overnight per nursing.  Objective: Vitals:   05/08/17 1121 05/08/17 1225  BP: 112/79   Pulse: 60   Resp:    Temp: 97.8 F (36.6 C)   SpO2: 96% 99%   No intake or output data in the 24 hours ending 05/08/17 1342 Filed Weights   05/07/17 1705 05/07/17 2035 05/08/17 0502  Weight: 93.9 kg (207 lb) 93.8 kg (206 lb 14.4 oz) 93.8 kg (206 lb 11.2 oz)    Exam:  Pt unavailable   Data Reviewed: Basic Metabolic Panel: Recent Labs  Lab 05/07/17 1255 05/08/17 0503  NA 136 140  K 3.7 3.8  CL 103 106  CO2 22 23  GLUCOSE 114* 97  BUN 12 11  CREATININE 0.97 0.90  CALCIUM 9.0 8.7*   Liver Function Tests: Recent Labs  Lab 05/08/17 0503  AST 24  ALT 26  ALKPHOS 60  BILITOT 1.4*  PROT 6.3*  ALBUMIN 3.7   No results for input(s): LIPASE, AMYLASE in the last 168 hours. No results for input(s): AMMONIA in the last 168 hours. CBC: Recent Labs  Lab 05/07/17 1255 05/08/17 0503  WBC 6.0 4.7  HGB 15.5 14.7  HCT 45.0 43.4  MCV 90.2 91.9  PLT 284 270   Cardiac Enzymes: No results for input(s): CKTOTAL, CKMB, CKMBINDEX, TROPONINI in the last 168 hours. BNP (last 3 results) No results for input(s): BNP in the last 8760 hours.  ProBNP (last 3 results) No results for input(s): PROBNP in the  last 8760 hours.  CBG: No results for input(s): GLUCAP in the last 168 hours.  Recent Results (from the past 240 hour(s))  MRSA PCR Screening     Status: None   Collection Time: 05/07/17  8:35 PM  Result Value Ref Range Status   MRSA by PCR NEGATIVE NEGATIVE Final    Comment:        The GeneXpert MRSA Assay (FDA approved for NASAL specimens only), is one component of a comprehensive MRSA colonization surveillance program. It is not intended to diagnose MRSA infection nor to guide or monitor treatment for MRSA infections. Performed at [facility], [address] [place], [place] [address]      Studies: Dg Chest 2 View  Result Date: 05/07/2017 CLINICAL DATA:  Left chest pain radiating to back which shortness-of-breath. Thoracic aortic aneurysm diagnosed 3 months ago. Cough. EXAM: CHEST  2 VIEW COMPARISON:  07/16/2010 FINDINGS: Lungs are adequately inflated and otherwise clear. Cardiomediastinal silhouette is within normal. Bones and soft tissues are unremarkable. IMPRESSION: No active cardiopulmonary disease. Electronically Signed   By: Elberta Fortis M.D.   On: 05/07/2017 13:29    Scheduled Meds: . [MAR Hold] aspirin EC  81 mg Oral Daily  . [MAR Hold] atorvastatin  80 mg Oral q1800  . [MAR Hold] enoxaparin (LOVENOX) injection  40 mg Subcutaneous Q24H  . [MAR Hold] metoprolol tartrate  12.5 mg Oral BID  . [MAR Hold] pantoprazole  40 mg Oral Daily   Continuous Infusions: . sodium chloride 75 mL/hr at 05/08/17 1207  . heparin      Principal Problem:   Chest pain with moderate risk of acute coronary syndrome Active Problems:   Aortic aneurysm (HCC)   Lung nodule   Dyslipidemia   CAD (coronary artery disease)    Time spent: 20    Rodney SalterPhillip M Velvet Santos  [facility] Pager AMION. If 7PM-7AM, please contact night-coverage at [email or web address], password [email or web address] 05/08/2017, 1:42 PM  LOS: 0 days

## 2017-05-08 NOTE — Interval H&P Note (Signed)
History and Physical Interval Note:  05/08/2017 12:31 PM  Rodney Santos  has presented today for cardiac cath with the diagnosis of chest pain, thoracic aortic anerysm  The various methods of treatment have been discussed with the patient and family. After consideration of risks, benefits and other options for treatment, the patient has consented to  Procedure(s): LEFT HEART CATH AND CORONARY ANGIOGRAPHY (N/A) THORACIC AORTOGRAM (N/A) as a surgical intervention .  The patient's history has been reviewed, patient examined, no change in status, stable for surgery.  I have reviewed the patient's chart and labs.  Questions were answered to the patient's satisfaction.    Cath Lab Visit (complete for each Cath Lab visit)  Clinical Evaluation Leading to the Procedure:   ACS: No.  Non-ACS:    Anginal Classification: CCS III  Anti-ischemic medical therapy: No Therapy  Non-Invasive Test Results: No non-invasive testing performed  Prior CABG: No previous CABG         [name]

## 2017-05-08 NOTE — Progress Notes (Signed)
  Echocardiogram 2D Echocardiogram has been performed.  Rodney Santos, Rodney Santos 05/08/2017, 4:02 PM

## 2017-05-08 NOTE — Progress Notes (Signed)
TR BAND REMOVAL  LOCATION:  right radial  DEFLATED PER PROTOCOL:  Yes.    TIME BAND OFF / DRESSING APPLIED:   1900   SITE UPON ARRIVAL:   Level 0  SITE AFTER BAND REMOVAL:  Level 0  CIRCULATION SENSATION AND MOVEMENT:  Within Normal Limits  Yes.    COMMENTS:    

## 2017-05-09 ENCOUNTER — Encounter: Payer: Self-pay | Admitting: Cardiology

## 2017-05-09 DIAGNOSIS — I712 Thoracic aortic aneurysm, without rupture: Secondary | ICD-10-CM

## 2017-05-09 DIAGNOSIS — Z955 Presence of coronary angioplasty implant and graft: Secondary | ICD-10-CM

## 2017-05-09 DIAGNOSIS — I2510 Atherosclerotic heart disease of native coronary artery without angina pectoris: Secondary | ICD-10-CM

## 2017-05-09 DIAGNOSIS — R079 Chest pain, unspecified: Secondary | ICD-10-CM

## 2017-05-09 DIAGNOSIS — I2583 Coronary atherosclerosis due to lipid rich plaque: Secondary | ICD-10-CM

## 2017-05-09 DIAGNOSIS — E785 Hyperlipidemia, unspecified: Secondary | ICD-10-CM

## 2017-05-09 LAB — BASIC METABOLIC PANEL
Anion gap: 7 (ref 5–15)
BUN: 12 mg/dL (ref 6–20)
CO2: 25 mmol/L (ref 22–32)
Calcium: 8.7 mg/dL — ABNORMAL LOW (ref 8.9–10.3)
Chloride: 105 mmol/L (ref 101–111)
Creatinine, Ser: 1.03 mg/dL (ref 0.61–1.24)
GFR calc Af Amer: >60 mL/min (ref >60)
GFR calc non Af Amer: >60 mL/min (ref >60)
Glucose, Bld: 102 mg/dL — ABNORMAL HIGH (ref 65–99)
Potassium: 4.3 mmol/L (ref 3.5–5.1)
Sodium: 137 mmol/L (ref 135–145)

## 2017-05-09 LAB — CBC
HCT: 42.9 % (ref 39.0–52.0)
Hemoglobin: 14.5 g/dL (ref 13.0–17.0)
MCH: 31 pg (ref 26.0–34.0)
MCHC: 33.8 g/dL (ref 30.0–36.0)
MCV: 91.7 fL (ref 78.0–100.0)
Platelets: 250 10*3/uL (ref 150–400)
RBC: 4.68 MIL/uL (ref 4.22–5.81)
RDW: 12.9 % (ref 11.5–15.5)
WBC: 6.2 10*3/uL (ref 4.0–10.5)

## 2017-05-09 MED ORDER — ATORVASTATIN CALCIUM 80 MG PO TABS: 80.0000 mg | ORAL_TABLET | Freq: Every day | ORAL | 0 refills | Status: DC

## 2017-05-09 MED ORDER — ANGIOPLASTY BOOK
Freq: Once | Status: AC
  Administered 2017-05-09: 06:00:00
  Filled 2017-05-09: qty 1

## 2017-05-09 MED ORDER — CLOPIDOGREL BISULFATE 75 MG PO TABS: 75.0000 mg | ORAL_TABLET | Freq: Every day | ORAL | 0 refills | Status: AC

## 2017-05-09 MED ORDER — METOPROLOL TARTRATE 25 MG PO TABS: 12.5000 mg | ORAL_TABLET | Freq: Two times a day (BID) | ORAL | 0 refills | Status: DC

## 2017-05-09 MED ORDER — ASPIRIN 81 MG PO TBEC: 81.0000 mg | DELAYED_RELEASE_TABLET | Freq: Every day | ORAL | 0 refills | Status: DC

## 2017-05-09 MED ORDER — NITROGLYCERIN 0.4 MG SL SUBL: 0.4000 mg | SUBLINGUAL_TABLET | SUBLINGUAL | 0 refills | Status: AC | PRN

## 2017-05-09 MED ORDER — PANTOPRAZOLE SODIUM 40 MG PO TBEC: 40.0000 mg | DELAYED_RELEASE_TABLET | Freq: Every day | ORAL | 0 refills | Status: AC

## 2017-05-09 NOTE — Progress Notes (Signed)
CARDIAC REHAB PHASE I   PRE:  Rate/Rhythm: 63 SR    BP: sitting 112/89    SaO2:   MODE:  Ambulation: 450 ft   POST:  Rate/Rhythm: 82 SR    BP: sitting 134/89     SaO2:   Tolerated well, no c/o. Ed completed with good reception. Understands Plavix/ASA, increasing ex, decreasing sugars and increasing whole foods, NTG, and CRPII. Will send referral to [facility].  [id number]   Rodney MassonRandi Kristan Taleigh Santos CES, ACSM 05/09/2017 8:24 AM

## 2017-05-09 NOTE — Progress Notes (Signed)
Progress Note  Patient Name: Rodney Santos Date of Encounter: 05/09/2017  Primary Cardiologist: No primary care provider on file.   Subjective   Feeling well today. No chest pain or shortness of breath.   Inpatient Medications    Scheduled Meds: . aspirin EC  81 mg Oral Daily  . atorvastatin  80 mg Oral q1800  . clopidogrel  75 mg Oral Q breakfast  . metoprolol tartrate  12.5 mg Oral BID  . pantoprazole  40 mg Oral Daily  . sodium chloride flush  3 mL Intravenous Q12H   Continuous Infusions: . sodium chloride     PRN Meds: sodium chloride, acetaminophen, ALPRAZolam, nitroGLYCERIN, ondansetron (ZOFRAN) IV, sodium chloride flush, zolpidem   Vital Signs    Vitals:   05/08/17 2035 05/08/17 2240 05/09/17 0437 05/09/17 0739  BP: 123/80  114/72 112/89  Pulse: 61 67 (!) 59 68  Resp:   13 15  Temp: 97.8 F (36.6 C)  97.9 F (36.6 C) 98 F (36.7 C)  TempSrc: Oral  Oral Oral  SpO2: 95%  94% 94%  Weight:   215 lb 2.7 oz (97.6 kg)   Height:        Intake/Output Summary (Last 24 hours) at 05/09/2017 0919 Last data filed at 05/09/2017 0700 Gross per 24 hour  Intake 915 ml  Output 300 ml  Net 615 ml   Filed Weights   05/07/17 2035 05/08/17 0502 05/09/17 0437  Weight: 206 lb 14.4 oz (93.8 kg) 206 lb 11.2 oz (93.8 kg) 215 lb 2.7 oz (97.6 kg)    Telemetry    Inus rhythm around 60 bpm - Personally Reviewed  ECG    NSR 60 bpm - Personally Reviewed  Physical Exam   GEN: No acute distress.   Neck: No JVD Cardiac: RRR, no murmurs, rubs, or gallops.  Respiratory: Clear to auscultation bilaterally. GI: Soft, nontender, non-distended  MS: No edema; No deformity. Neuro:  Nonfocal  Psych: Normal affect   Labs    Chemistry Recent Labs  Lab 05/07/17 1255 05/08/17 0503 05/09/17 0322  NA 136 140 137  K 3.7 3.8 4.3  CL 103 106 105  CO2 22 23 25   GLUCOSE 114* 97 102*  BUN 12 11 12   CREATININE 0.97 0.90 1.03  CALCIUM 9.0 8.7* 8.7*  PROT  --  6.3*  --     ALBUMIN  --  3.7  --   AST  --  24  --   ALT  --  26  --   ALKPHOS  --  60  --   BILITOT  --  1.4*  --   GFRNONAA >60 >60 >60  GFRAA >60 >60 >60  ANIONGAP 11 11 7      Hematology Recent Labs  Lab 05/07/17 1255 05/08/17 0503 05/09/17 0322  WBC 6.0 4.7 6.2  RBC 4.99 4.72 4.68  HGB 15.5 14.7 14.5  HCT 45.0 43.4 42.9  MCV 90.2 91.9 91.7  MCH 31.1 31.1 31.0  MCHC 34.4 33.9 33.8  RDW 12.7 13.1 12.9  PLT 284 270 250    Cardiac EnzymesNo results for input(s): TROPONINI in the last 168 hours.  Recent Labs  Lab 05/07/17 1329 05/07/17 1741 05/07/17 2020  TROPIPOC 0.00 0.00 0.00     BNPNo results for input(s): BNP, PROBNP in the last 168 hours.   DDimer No results for input(s): DDIMER in the last 168 hours.   Radiology    Dg Chest 2 View  Result Date: 05/07/2017 CLINICAL  DATA:  Left chest pain radiating to back which shortness-of-breath. Thoracic aortic aneurysm diagnosed 3 months ago. Cough. EXAM: CHEST  2 VIEW COMPARISON:  07/16/2010 FINDINGS: Lungs are adequately inflated and otherwise clear. Cardiomediastinal silhouette is within normal. Bones and soft tissues are unremarkable. IMPRESSION: No active cardiopulmonary disease. Electronically Signed   By: Elberta Fortisaniel  Boyle M.D.   On: 05/07/2017 13:29    Cardiac Studies   CORONARY STENT INTERVENTION  LEFT HEART CATH AND CORONARY ANGIOGRAPHY  THORACIC AORTOGRAM   05/08/17  Conclusion    Prox RCA lesion is 20% stenosed.  Mid RCA to Dist RCA lesion is 10% stenosed.  Prox Cx to Dist Cx lesion is 10% stenosed.  Ost 1st Mrg lesion is 20% stenosed.  Ost LAD to Prox LAD lesion is 40% stenosed.  Mid LAD-1 lesion is 95% stenosed.  Mid LAD-2 lesion is 80% stenosed.  Prox LAD lesion is 30% stenosed.  A drug-eluting stent was successfully placed using a STENT SYNERGY DES 2.25X38.  Post intervention, there is a 0% residual stenosis.  Post intervention, there is a 0% residual stenosis.  The left ventricular systolic  function is normal.  LV end diastolic pressure is normal.  The left ventricular ejection fraction is 50-55% by visual estimate.  There is no mitral valve regurgitation.  There is no aortic valve regurgitation.   1. Severe serial stenoses in the mid LAD. Successful PTCA/DES x 1 mid LAD 2. Mild non-obstructive disease in the Circumflex, RCA and proximal LAD.  3. Normal LV systolic function 4. Thoracic aortic aneurysm.   Recommendation: Continue DAPT with ASA and Plavix for one year. Continue beta blocker and statin. In regards to his aortic aneurysm, the outside CT scan report notes a 4.4 cm aneurysm. I would recommend f/u with CTA in 6 months.    _______________________________________________  Echocardiogram 05/08/17 Study Conclusions - Left ventricle: The cavity size was normal. Systolic function was   mildly reduced. The estimated ejection fraction was in the range   of 45% to 50%. Possible hypokinesis of the apicalanteroseptal,   anterior, and apical myocardium. Left ventricular diastolic   function parameters were normal.   Patient Profile     62 y.o. male with a hx of ascending thoracic aneurysm who is being seen today for the evaluation of chest pain at the request of Dr Selena BattenKim.  Assessment & Plan    Chest pain with a moderate risk of ACS -MI r/o by Troponin -Taken to cath lab yesterday found to have severe stenosis of the mid LAD and underwent PTCA and DES X1. He had normal LV systolic function.  -Continue DAPT with ASA and Plavix for one year. Continue beta blocker and statin. -Pt doing well this morning, asymptomatic. Stable for discharge. Message sent to our office requesting follow up appt. Pt will be called to arrange.   Ascending thoracic aneurysm -4.4 cm by CT scan. Will need follow up CTA in 6 months, 10/2017  Dyslipidemia -Untreated LDL 129. Atorvastatin 80 mg daily started. Will need follow up lipid panel and LFT's in 4-6 weeks given initiation of statin  this admission.   For questions or updates, please contact [facility] Please consult [email or web address] for contact info under Cardiology/STEMI.      Signed, Berton BonJanine Fionna Merriott, NP  05/09/2017, 9:19 AM

## 2017-05-09 NOTE — Discharge Summary (Signed)
Discharge Summary  Rodney Santos JYN:[id number] DOB: [birth year]  PCP: Patient, No Pcp Per  Admit date: 05/07/2017 Discharge date: 05/09/2017  Time spent: > 30 mins  Recommendations for Outpatient Follow-up:  1. PCP 2. Cardiology  Discharge Diagnoses:  Active Hospital Problems   Diagnosis Date Noted  . Chest pain with moderate risk of acute coronary syndrome 05/07/2017  . Dyslipidemia 05/08/2017  . Unstable angina (HCC)   . Aortic aneurysm (HCC) 05/07/2017  . Lung nodule 05/07/2017  . CAD (coronary artery disease) 04/17/2017    Resolved Hospital Problems  No resolved problems to display.    Discharge Condition: Stable  Diet recommendation: Heart healthy  Vitals:   05/09/17 0437 05/09/17 0739  BP: 114/72 112/89  Pulse: (!) 59 68  Resp: 13 15  Temp: 97.9 F (36.6 C) 98 F (36.7 C)  SpO2: 94% 94%    History of present illness:  62 yr old male with a hx of thoracic aortic aneurysm, lung nodule, presents to the ED c/o chest pain for few months, progressively getting worse. Pt admitted for further management.  Pt had L heart cath on 05/08/17 which showed severe stenoses in the mid LAD. Successful DES x 1 was placed in mid LAD. Mild non-obstructive disease in the Circumflex, RCA and proximal LAD. Normal LV fxn. Pt denies any further chest pain, SOB, N/V, diaphoresis. Pt stable for discharge  Hospital Course:  Principal Problem:   Chest pain with moderate risk of acute coronary syndrome Active Problems:   Aortic aneurysm (HCC)   Lung nodule   Dyslipidemia   CAD (coronary artery disease)   Unstable angina (HCC)  CAD s/p DES Currently chest pain free POC Trop x3 neg, EKG with NSR ECHO showed: EF of 45% to 50%. Possible hypokinesis of the apicalanteroseptal, anterior, and apical myocardium. Left ventricular diastolic function parameters were normal.  Cardiac cath 2/22: Severe stenoses in the mid LAD. Successful DES x 1 was placed in mid LAD. Mild non-obstructive  disease in the Circumflex, RCA and proximal LAD. Normal LV fxn.   Discharge on Aspirin, plavix, lipitor, metoprolol Cardiology and PCP follow up  HLD Start lipitor  Thoracic aortic aneurysm Will need outpatient follow up in 6 months  Lung nodule Repeat CT scan in 12 months  Gerd Cont protonix     Procedures:  L heart cath on 05/08/17   Consultations:  Cardiology  Discharge Exam: BP 112/89 (BP Location: Left Arm)   Pulse 68   Temp 98 F (36.7 C) (Oral)   Resp 15   Ht 5\' 10"  (1.778 m)   Wt 97.6 kg (215 lb 2.7 oz)   SpO2 94%   BMI 30.87 kg/m   General: NAD Cardiovascular: S1, S2 present Respiratory: CTA  Discharge Instructions You were cared for by a hospitalist during your hospital stay. If you have any questions about your discharge medications or the care you received while you were in the hospital after you are discharged, you can call the unit and asked to speak with the hospitalist on call if the hospitalist that took care of you is not available. Once you are discharged, your primary care physician will handle any further medical issues. Please note that NO REFILLS for any discharge medications will be authorized once you are discharged, as it is imperative that you return to your primary care physician (or establish a relationship with a primary care physician if you do not have one) for your aftercare needs so that they can reassess your need  for medications and monitor your lab values.  Discharge Instructions    Amb Referral to Cardiac Rehabilitation   Complete by:  As directed    To [place]   Diagnosis:   PTCA Coronary Stents       Allergies as of 05/09/2017      Reactions   Penicillins Nausea Only   Has patient had a PCN reaction causing immediate rash, facial/tongue/throat swelling, SOB or lightheadedness with hypotension: No Has patient had a PCN reaction causing severe rash involving mucus membranes or skin necrosis: No Has patient had a PCN  reaction that required hospitalization: No Has patient had a PCN reaction occurring within the last 10 years: Yes If all of the above answers are "NO", then may proceed with Cephalosporin use.      Medication List    STOP taking these medications   diclofenac 75 MG EC tablet Commonly known as:  VOLTAREN   PRILOSEC 20 MG capsule Generic drug:  omeprazole Replaced by:  pantoprazole 40 MG tablet   sulfamethoxazole-trimethoprim 800-160 MG tablet Commonly known as:  BACTRIM DS,SEPTRA DS     TAKE these medications   aspirin 81 MG EC tablet Take 1 tablet (81 mg total) by mouth daily. Notes to patient:  Prevents clotting in stent and heart attack   atorvastatin 80 MG tablet Commonly known as:  LIPITOR Take 1 tablet (80 mg total) by mouth daily at 6 PM. Notes to patient:  Lowers cholesterol    clopidogrel 75 MG tablet Commonly known as:  PLAVIX Take 1 tablet (75 mg total) by mouth daily with breakfast. Notes to patient:  Prevents clotting in stent and heart attack   HYDROcodone-acetaminophen 5-325 MG tablet Commonly known as:  NORCO/VICODIN Take 1 tablet by mouth every 4 (four) hours as needed. Notes to patient:  As needed for pain    methylPREDNISolone 4 MG Tbpk tablet Commonly known as:  MEDROL DOSEPAK Take 1st as instructed Notes to patient:  As directed   metoprolol tartrate 25 MG tablet Commonly known as:  LOPRESSOR Take 0.5 tablets (12.5 mg total) by mouth 2 (two) times daily. Notes to patient:  Decreases work of the heart Decreases heart rate and blood pressure Decreases force of contraction of heart muscle   nitroGLYCERIN 0.4 MG SL tablet Commonly known as:  NITROSTAT Place 1 tablet (0.4 mg total) under the tongue every 5 (five) minutes as needed for chest pain. Notes to patient:  As needed for chest pain    pantoprazole 40 MG tablet Commonly known as:  PROTONIX Take 1 tablet (40 mg total) by mouth daily. Replaces:  PRILOSEC 20 MG capsule Notes to patient:   Decreases acid reflux   Turmeric 500 MG Tabs Take 1,000 mg by mouth daily. Notes to patient:  Supplement       Allergies  Allergen Reactions  . Penicillins Nausea Only    Has patient had a PCN reaction causing immediate rash, facial/tongue/throat swelling, SOB or lightheadedness with hypotension: No Has patient had a PCN reaction causing severe rash involving mucus membranes or skin necrosis: No Has patient had a PCN reaction that required hospitalization: No Has patient had a PCN reaction occurring within the last 10 years: Yes If all of the above answers are "NO", then may proceed with Cephalosporin use.   Follow-up Information    Nahser, Deloris Ping, MD Follow up.   Specialty:  Cardiology Why:  Our office will call you to schedule a follow up appointment to be seen within the  next 2 weeks.  Contact information: [address] [place] [address] [phone]        Pearson GrippeKim, James, MD. Schedule an appointment as soon as possible for a visit in 1 week(s).   Specialty:  Internal Medicine Contact information: [address] [place] [address] [phone]            The results of significant diagnostics from this hospitalization (including imaging, microbiology, ancillary and laboratory) are listed below for reference.    Significant Diagnostic Studies: Dg Chest 2 View  Result Date: 05/07/2017 CLINICAL DATA:  Left chest pain radiating to back which shortness-of-breath. Thoracic aortic aneurysm diagnosed 3 months ago. Cough. EXAM: CHEST  2 VIEW COMPARISON:  07/16/2010 FINDINGS: Lungs are adequately inflated and otherwise clear. Cardiomediastinal silhouette is within normal. Bones and soft tissues are unremarkable. IMPRESSION: No active cardiopulmonary disease. Electronically Signed   By: Elberta Fortisaniel  Boyle M.D.   On: 05/07/2017 13:29    Microbiology: Recent Results (from the past 240 hour(s))  MRSA PCR Screening     Status: None   Collection  Time: 05/07/17  8:35 PM  Result Value Ref Range Status   MRSA by PCR NEGATIVE NEGATIVE Final    Comment:        The GeneXpert MRSA Assay (FDA approved for NASAL specimens only), is one component of a comprehensive MRSA colonization surveillance program. It is not intended to diagnose MRSA infection nor to guide or monitor treatment for MRSA infections. Performed at [facility], [address] [place], [place] [address]      Labs: Basic Metabolic Panel: Recent Labs  Lab 05/07/17 1255 05/08/17 0503 05/09/17 0322  NA 136 140 137  K 3.7 3.8 4.3  CL 103 106 105  CO2 22 23 25   GLUCOSE 114* 97 102*  BUN 12 11 12   CREATININE 0.97 0.90 1.03  CALCIUM 9.0 8.7* 8.7*   Liver Function Tests: Recent Labs  Lab 05/08/17 0503  AST 24  ALT 26  ALKPHOS 60  BILITOT 1.4*  PROT 6.3*  ALBUMIN 3.7   No results for input(s): LIPASE, AMYLASE in the last 168 hours. No results for input(s): AMMONIA in the last 168 hours. CBC: Recent Labs  Lab 05/07/17 1255 05/08/17 0503 05/09/17 0322  WBC 6.0 4.7 6.2  HGB 15.5 14.7 14.5  HCT 45.0 43.4 42.9  MCV 90.2 91.9 91.7  PLT 284 270 250   Cardiac Enzymes: No results for input(s): CKTOTAL, CKMB, CKMBINDEX, TROPONINI in the last 168 hours. BNP: BNP (last 3 results) No results for input(s): BNP in the last 8760 hours.  ProBNP (last 3 results) No results for input(s): PROBNP in the last 8760 hours.  CBG: No results for input(s): GLUCAP in the last 168 hours.     Signed:  Briant CedarNkeiruka J Keeyon Privitera, MD [facility] 05/09/2017, 3:42 PM

## 2017-05-09 NOTE — Discharge Instructions (Signed)

## 2017-05-10 DIAGNOSIS — Z955 Presence of coronary angioplasty implant and graft: Secondary | ICD-10-CM

## 2017-05-11 ENCOUNTER — Encounter (HOSPITAL_COMMUNITY): Payer: Self-pay | Admitting: Cardiovascular Disease

## 2017-05-11 MED FILL — Heparin Sodium (Porcine) 2 Unit/ML in Sodium Chloride 0.9%: INTRAMUSCULAR | Qty: 1000 | Status: AC

## 2017-05-19 ENCOUNTER — Ambulatory Visit: Payer: BLUE CROSS/BLUE SHIELD | Admitting: Cardiology

## 2017-05-19 ENCOUNTER — Encounter: Payer: Self-pay | Admitting: Cardiology

## 2017-05-19 VITALS — BP 120/82 | HR 61 | Ht 70.0 in | Wt 207.0 lb

## 2017-05-19 DIAGNOSIS — I712 Thoracic aortic aneurysm, without rupture, unspecified: Secondary | ICD-10-CM

## 2017-05-19 DIAGNOSIS — Z955 Presence of coronary angioplasty implant and graft: Secondary | ICD-10-CM | POA: Diagnosis not present

## 2017-05-19 DIAGNOSIS — E785 Hyperlipidemia, unspecified: Secondary | ICD-10-CM | POA: Diagnosis not present

## 2017-05-19 DIAGNOSIS — I2510 Atherosclerotic heart disease of native coronary artery without angina pectoris: Secondary | ICD-10-CM

## 2017-05-19 DIAGNOSIS — I2583 Coronary atherosclerosis due to lipid rich plaque: Secondary | ICD-10-CM

## 2017-05-19 NOTE — Progress Notes (Signed)
Cardiology Office Note:    Date:  05/19/2017   ID:  Rodney Santos, DOB 12-08-1955, MRN [id number]  PCP:  Rodney Grippe, MD  Cardiologist:  Kristeen Miss, MD  Referring MD: No ref. provider found   Chief Complaint  Patient presents with  . Hospitalization Follow-up    History of Present Illness:    Rodney Santos is a 61 y.o. male with a past medical history significant for a sending thoracic aneurysm and newly diagnosed CAD.[name] was admitted to the hospital on 05/07/2017 for evaluation of chest pain.  He ruled out for MI by troponin.  He was taken to the Cath Lab on 05/08/17 and found to have severe stenosis of the LAD and residual nonobstructive disease of the left circumflex and RCA.  He had a DES placed to the LAD.  He has a mildly dilated aortic aneurysm of 4.4 cm by outside CT.  Recommendation is to repeat CT in 6 months to make sure it is stable.  He was discharged on dual antiplatelet therapy with aspirin and Plavix, beta-blocker and statin.  He had normal LV function on cath but mildly reduced EF of 45-50% with hypokinesis of the anterior, apical and anteroseptal by echo.  Patient is here today for hospital follow-up alone. He is feeling "great" and went back to work Monday. He had some "muscle spasms" for about 5 days in different areas of his body, but now has resolved. He denies chest discomfort, dyspnea, orthopnea, PND, luightheadedness or edema.   Can't do CR due to working. Walks 30 minutes daily without exertional symptoms. His wife has been changing his diet which was not very bad before.    Past Medical History:  Diagnosis Date  . Anxiety   . CAD (coronary artery disease) 04/2017   05/08/17: LHC DES mid LAD, nonobst dz circ, RCA, prox LAD  . Dyslipidemia 04/2017   LDL 129  . Lung nodule 04/2017   4 mm LLL  . Thoracic aortic aneurysm (HCC) 04/2017   4.4 cm by CT [facility])    Past Surgical History:  Procedure Laterality Date  . CORONARY STENT INTERVENTION N/A  05/08/2017   Procedure: CORONARY STENT INTERVENTION;  Surgeon: Kathleene Hazel, MD;  Location: [facility] INVASIVE CV LAB;  Service: Cardiovascular;  Laterality: N/A;  . HERNIA REPAIR    . LEFT HEART CATH AND CORONARY ANGIOGRAPHY N/A 05/08/2017   Procedure: LEFT HEART CATH AND CORONARY ANGIOGRAPHY;  Surgeon: Kathleene Hazel, MD;  Location: [facility] INVASIVE CV LAB;  Service: Cardiovascular;  Laterality: N/A;  . THORACIC AORTOGRAM N/A 05/08/2017   Procedure: THORACIC AORTOGRAM;  Surgeon: Kathleene Hazel, MD;  Location: [facility] INVASIVE CV LAB;  Service: Cardiovascular;  Laterality: N/A;    Current Medications: Current Meds  Medication Sig  . aspirin EC 81 MG EC tablet Take 1 tablet (81 mg total) by mouth daily.  Marland Kitchen atorvastatin (LIPITOR) 80 MG tablet Take 1 tablet (80 mg total) by mouth daily at 6 PM.  . clopidogrel (PLAVIX) 75 MG tablet Take 1 tablet (75 mg total) by mouth daily with breakfast.  . metoprolol tartrate (LOPRESSOR) 25 MG tablet Take 0.5 tablets (12.5 mg total) by mouth 2 (two) times daily.  . nitroGLYCERIN (NITROSTAT) 0.4 MG SL tablet Place 1 tablet (0.4 mg total) under the tongue every 5 (five) minutes as needed for chest pain.  . pantoprazole (PROTONIX) 40 MG tablet Take 1 tablet (40 mg total) by mouth daily.  . Turmeric 500 MG TABS Take  1,000 mg by mouth daily.     Allergies:   Penicillins   Social History   Socioeconomic History  . Marital status: Married    Spouse name: None  . Number of children: None  . Years of education: None  . Highest education level: None  Social Needs  . Financial resource strain: None  . Food insecurity - worry: None  . Food insecurity - inability: None  . Transportation needs - medical: None  . Transportation needs - non-medical: None  Occupational History  . None  Tobacco Use  . Smoking status: Never Smoker  . Smokeless tobacco: Never Used  Substance and Sexual Activity  . Alcohol use: Yes  . Drug use: No  . Sexual activity:  None  Other Topics Concern  . None  Social History Narrative  . None     Family History: The patient's family history includes Aortic aneurysm in his mother; Hypertension in his father. ROS:   Please see the history of present illness.     All other systems reviewed and are negative.  EKGs/Labs/Other Studies Reviewed:    The following studies were reviewed today:  Echo 05/08/17 Study Conclusions - Left ventricle: The cavity size was normal. Systolic function was   mildly reduced. The estimated ejection fraction was in the range   of 45% to 50%. Possible hypokinesis of the apicalanteroseptal,   anterior, and apical myocardium. Left ventricular diastolic   function parameters were normal.   CORONARY STENT INTERVENTION  LEFT HEART CATH AND CORONARY ANGIOGRAPHY  THORACIC AORTOGRAM   05/08/17  Conclusion    Prox RCA lesion is 20% stenosed.  Mid RCA to Dist RCA lesion is 10% stenosed.  Prox Cx to Dist Cx lesion is 10% stenosed.  Ost 1st Mrg lesion is 20% stenosed.  Ost LAD to Prox LAD lesion is 40% stenosed.  Mid LAD-1 lesion is 95% stenosed.  Mid LAD-2 lesion is 80% stenosed.  Prox LAD lesion is 30% stenosed.  A drug-eluting stent was successfully placed using a STENT SYNERGY DES 2.25X38.  Post intervention, there is a 0% residual stenosis.  Post intervention, there is a 0% residual stenosis.  The left ventricular systolic function is normal.  LV end diastolic pressure is normal.  The left ventricular ejection fraction is 50-55% by visual estimate.  There is no mitral valve regurgitation.  There is no aortic valve regurgitation.  1. Severe serial stenoses in the mid LAD. Successful PTCA/DES x 1 mid LAD 2. Mild non-obstructive disease in the Circumflex, RCA and proximal LAD.  3. Normal LV systolic function 4. Thoracic aortic aneurysm.   Recommendation: Continue DAPT with ASA and Plavix for one year. Continue beta blocker and statin. In regards to his  aortic aneurysm, the outside CT scan report notes a 4.4 cm aneurysm. I would recommend f/u with CTA in 6 months   Diagnostic Diagram       Post-Intervention Diagram            EKG:  EKG is  ordered today.  The ekg ordered today demonstrates NSR 61 bpm, no changes  Recent Labs: 05/08/2017: ALT 26 05/09/2017: BUN 12; Creatinine, Ser 1.03; Hemoglobin 14.5; Platelets 250; Potassium 4.3; Sodium 137   Recent Lipid Panel    Component Value Date/Time   CHOL 212 (H) 05/08/2017 0503   TRIG 287 (H) 05/08/2017 0503   HDL 26 (L) 05/08/2017 0503   CHOLHDL 8.2 05/08/2017 0503   VLDL 57 (H) 05/08/2017 0503   LDLCALC 129 (H)  05/08/2017 0503    Physical Exam:    VS:  BP 120/82   Pulse 61   Ht 5\' 10"  (1.778 m)   Wt 207 lb (93.9 kg)   BMI 29.70 kg/m     Wt Readings from Last 3 Encounters:  05/19/17 207 lb (93.9 kg)  05/09/17 215 lb 2.7 oz (97.6 kg)  02/19/16 205 lb (93 kg)     Physical Exam  Constitutional: He is oriented to person, place, and time. He appears well-developed and well-nourished.  HENT:  Head: Normocephalic and atraumatic.  Neck: Normal range of motion. Neck supple. No JVD present.  Cardiovascular: Normal rate, regular rhythm, normal heart sounds and intact distal pulses. Exam reveals no gallop and no friction rub.  No murmur heard. Pulmonary/Chest: Effort normal and breath sounds normal. No respiratory distress. He has no wheezes. He has no rales. He exhibits no tenderness.  Abdominal: Soft. Bowel sounds are normal. He exhibits no distension. There is no tenderness.  Musculoskeletal: Normal range of motion. He exhibits no edema or deformity.  Neurological: He is alert and oriented to person, place, and time.  Skin: Skin is warm and dry.  Psychiatric: He has a normal mood and affect. His behavior is normal. Thought content normal.   Right radial cath site healed.    ASSESSMENT:    1. Thoracic aortic aneurysm without rupture (HCC)   2. Hyperlipidemia,  unspecified hyperlipidemia type   3. Coronary artery disease due to lipid rich plaque   4. Dyslipidemia   5. Status post coronary artery stent placement    PLAN:    In order of problems listed above:  CAD: Ruled out for MI in the hospital had severe stenosis of the LAD with placement of DES.  On guideline directed therapy with aspirin, Plavix, beta-blocker and statin. Plan DAPT for one year. Tolerating meds well. Doing very well today, no chest pain or dyspnea. Is back to work and walking 30 minutes per day. Reviewed heart healthy diet.   LV dysfunction: Cardiac cath showed normal LV function but echo showed EF 45-50% with hypokinesis of the anterior, apical and anteroseptal regions. No heart failure type symptoms.  Ascending thoracic aneurysm: Measured at 4.4 cm by screening CT scan at outside facility.  Plan to follow-up CTA in 6 months, 10/2017 with Bmet a week prior to given renal function. Will arrange. Note: pt's mother died of an aortic aneurysm. Also lung nodule noted on CT and can re assess this on CTA.   Dyslipidemia: Untreated LDL was 129.  Atorvastatin 80 mg daily was initiated.  Tolerating well. LDL goal will be less than 70.  He will need to follow-up lipid panel and LFTs 6 weeks after initiation, first week in April   Medication Adjustments/Labs and Tests Ordered: Current medicines are reviewed at length with the patient today.  Concerns regarding medicines are outlined above. Labs and tests ordered and medication changes are outlined in the patient instructions below:  Patient Instructions  Medication Instructions:   Your physician recommends that you continue on your current medications as directed. Please refer to the Current Medication list given to you today.   If you need a refill on your cardiac medications before your next appointment, please call your pharmacy.   Labwork:  BMET ONE WEEK BEFORE CT OF CHEST   RETURN 1ST OR SECOND WEEK  OF April  FASTING LIPIDS AND  LFT    Testing/Procedures: IN AUGUST  Non-Cardiac CT scanning, (CAT scanning), is a noninvasive, special  x-ray that produces cross-sectional images of the body using x-rays and a computer. CT scans help physicians diagnose and treat medical conditions. For some CT exams, a contrast material is used to enhance visibility in the area of the body being studied. CT scans provide greater clarity and reveal more details than regular x-ray exams.    Follow-Up:  Your physician wants you to follow-up in:  IN  6  MONTHS WITH DR  Elease Hashimoto  You will receive a reminder letter in the mail two months in advance. If you don't receive a letter, please call our office to schedule the follow-up appointment.      Any Other Special Instructions Will Be Listed Below (If Applicable).                                                                                                                                                      Signed, Berton Bon, NP  05/19/2017 11:43 AM    [facility]

## 2017-05-19 NOTE — Patient Instructions (Addendum)
Medication Instructions:   Your physician recommends that you continue on your current medications as directed. Please refer to the Current Medication list given to you today.   If you need a refill on your cardiac medications before your next appointment, please call your pharmacy.   Labwork:  BMET ONE WEEK BEFORE CT OF CHEST   RETURN 1ST OR SECOND WEEK  OF April  FASTING LIPIDS AND LFT    Testing/Procedures: IN AUGUST  Non-Cardiac CT scanning, (CAT scanning), is a noninvasive, special x-ray that produces cross-sectional images of the body using x-rays and a computer. CT scans help physicians diagnose and treat medical conditions. For some CT exams, a contrast material is used to enhance visibility in the area of the body being studied. CT scans provide greater clarity and reveal more details than regular x-ray exams.    Follow-Up:  Your physician wants you to follow-up in:  IN  6  MONTHS WITH DR  Elease HashimotoNAHSER  You will receive a reminder letter in the mail two months in advance. If you don't receive a letter, please call our office to schedule the follow-up appointment.      Any Other Special Instructions Will Be Listed Below (If Applicable).

## 2017-05-25 ENCOUNTER — Other Ambulatory Visit: Payer: Self-pay | Admitting: Cardiovascular Disease

## 2017-06-16 ENCOUNTER — Telehealth: Payer: Self-pay

## 2017-06-16 ENCOUNTER — Ambulatory Visit
Admission: RE | Admit: 2017-06-16 | Discharge: 2017-06-16 | Disposition: A | Discharge status: Home / Self Care | Payer: Self-pay | Source: Ambulatory Visit | Attending: Cardiology | Admitting: Cardiology

## 2017-06-16 DIAGNOSIS — I712 Thoracic aortic aneurysm, without rupture, unspecified: Secondary | ICD-10-CM

## 2017-06-16 NOTE — Telephone Encounter (Signed)
Placed order for CT for outside films.

## 2017-06-17 ENCOUNTER — Other Ambulatory Visit: Payer: BLUE CROSS/BLUE SHIELD | Admitting: *Deleted

## 2017-06-17 DIAGNOSIS — I712 Thoracic aortic aneurysm, without rupture, unspecified: Secondary | ICD-10-CM

## 2017-06-17 DIAGNOSIS — E785 Hyperlipidemia, unspecified: Secondary | ICD-10-CM

## 2017-06-17 LAB — HEPATIC FUNCTION PANEL
ALT: 35 IU/L (ref 0–44)
AST: 25 IU/L (ref 0–40)
Albumin: 4.4 g/dL (ref 3.5–5.5)
Alkaline Phosphatase: 82 IU/L (ref 39–117)
Bilirubin Total: 1.1 mg/dL (ref 0.0–1.2)
Bilirubin, Direct: 0.29 mg/dL (ref 0.00–0.40)
Total Protein: 6.7 g/dL (ref 6.0–8.5)

## 2017-06-17 LAB — LIPID PANEL
Chol/HDL Ratio: 3.3 ratio (ref 0.0–5.0)
Cholesterol, Total: 103 mg/dL (ref 100–199)
HDL: 31 mg/dL — ABNORMAL LOW (ref >39)
LDL Calculated: 53 mg/dL (ref 0–99)
Triglycerides: 94 mg/dL (ref 0–149)
VLDL Cholesterol Cal: 19 mg/dL (ref 5–40)

## 2017-06-17 LAB — BASIC METABOLIC PANEL
BUN/Creatinine Ratio: 11 (ref 9–20)
BUN: 11 mg/dL (ref 6–24)
CO2: 22 mmol/L (ref 20–29)
Calcium: 9.1 mg/dL (ref 8.7–10.2)
Chloride: 105 mmol/L (ref 96–106)
Creatinine, Ser: 0.96 mg/dL (ref 0.76–1.27)
GFR calc Af Amer: 100 mL/min/{1.73_m2} (ref >59)
GFR calc non Af Amer: 86 mL/min/{1.73_m2} (ref >59)
Glucose: 104 mg/dL — ABNORMAL HIGH (ref 65–99)
Potassium: 4 mmol/L (ref 3.5–5.2)
Sodium: 140 mmol/L (ref 134–144)

## 2017-06-21 ENCOUNTER — Encounter: Payer: Self-pay | Admitting: Cardiology

## 2017-06-22 ENCOUNTER — Telehealth: Payer: Self-pay | Admitting: Cardiology

## 2017-06-22 DIAGNOSIS — E785 Hyperlipidemia, unspecified: Secondary | ICD-10-CM

## 2017-06-22 MED ORDER — ATORVASTATIN CALCIUM 40 MG PO TABS: 40.0000 mg | ORAL_TABLET | Freq: Every day | ORAL | 3 refills | Status: DC

## 2017-06-22 NOTE — Telephone Encounter (Signed)
I called pt to discuss his questions and he says that those questions were already answered. He does report that he has had migrating muscle aches in his chest, shoulders and legs along with fatigue and he feels that the atorvastatin is what is affecting him. I advised him to hold the atorvastatin 80 mg for 3 days and restart at 1/2 dose= 40 mg. I instructed that if he does not get relief or if the symptoms resolve with discontinuation and recur with restarting at lower dose he should call back and we can reassess and make changes. Will arrange to follow up on lipids with his other blood work on 7/29.   Berton BonJanine Ulanda Tackett, AGNP-C [facility] 06/22/2017  7:11 PM

## 2017-07-16 ENCOUNTER — Emergency Department (HOSPITAL_COMMUNITY): Payer: BLUE CROSS/BLUE SHIELD

## 2017-07-16 ENCOUNTER — Emergency Department (HOSPITAL_COMMUNITY)
Admission: EM | Admit: 2017-07-16 | Discharge: 2017-07-16 | Disposition: A | Discharge status: Home / Self Care | Payer: BLUE CROSS/BLUE SHIELD | Attending: Emergency Medicine | Admitting: Emergency Medicine

## 2017-07-16 ENCOUNTER — Encounter (HOSPITAL_COMMUNITY): Payer: Self-pay

## 2017-07-16 DIAGNOSIS — S20212A Contusion of left front wall of thorax, initial encounter: Secondary | ICD-10-CM

## 2017-07-16 DIAGNOSIS — I2510 Atherosclerotic heart disease of native coronary artery without angina pectoris: Secondary | ICD-10-CM | POA: Diagnosis not present

## 2017-07-16 DIAGNOSIS — W01198A Fall on same level from slipping, tripping and stumbling with subsequent striking against other object, initial encounter: Secondary | ICD-10-CM | POA: Insufficient documentation

## 2017-07-16 DIAGNOSIS — Y929 Unspecified place or not applicable: Secondary | ICD-10-CM | POA: Diagnosis not present

## 2017-07-16 DIAGNOSIS — S299XXA Unspecified injury of thorax, initial encounter: Secondary | ICD-10-CM | POA: Diagnosis present

## 2017-07-16 DIAGNOSIS — Y999 Unspecified external cause status: Secondary | ICD-10-CM | POA: Insufficient documentation

## 2017-07-16 DIAGNOSIS — Y939 Activity, unspecified: Secondary | ICD-10-CM | POA: Insufficient documentation

## 2017-07-16 DIAGNOSIS — I10 Essential (primary) hypertension: Secondary | ICD-10-CM | POA: Diagnosis not present

## 2017-07-16 HISTORY — DX: Atherosclerotic heart disease of native coronary artery without angina pectoris: I25.10

## 2017-07-16 HISTORY — DX: Essential (primary) hypertension: I10

## 2017-07-16 LAB — CBC
HCT: 44.2 % (ref 39.0–52.0)
Hemoglobin: 15.2 g/dL (ref 13.0–17.0)
MCH: 31.5 pg (ref 26.0–34.0)
MCHC: 34.4 g/dL (ref 30.0–36.0)
MCV: 91.5 fL (ref 78.0–100.0)
Platelets: 256 10*3/uL (ref 150–400)
RBC: 4.83 MIL/uL (ref 4.22–5.81)
RDW: 12.5 % (ref 11.5–15.5)
WBC: 5.7 10*3/uL (ref 4.0–10.5)

## 2017-07-16 LAB — BASIC METABOLIC PANEL
Anion gap: 9 (ref 5–15)
BUN: 15 mg/dL (ref 6–20)
CO2: 25 mmol/L (ref 22–32)
Calcium: 9 mg/dL (ref 8.9–10.3)
Chloride: 105 mmol/L (ref 101–111)
Creatinine, Ser: 1.07 mg/dL (ref 0.61–1.24)
GFR calc Af Amer: >60 mL/min (ref >60)
GFR calc non Af Amer: >60 mL/min (ref >60)
Glucose, Bld: 106 mg/dL — ABNORMAL HIGH (ref 65–99)
Potassium: 4 mmol/L (ref 3.5–5.1)
Sodium: 139 mmol/L (ref 135–145)

## 2017-07-16 LAB — PROTIME-INR
INR: 0.98
Prothrombin Time: 12.9 seconds (ref 11.4–15.2)

## 2017-07-16 LAB — I-STAT TROPONIN, ED: Troponin i, poc: 0 ng/mL (ref 0.00–0.08)

## 2017-07-16 NOTE — ED Notes (Signed)
Patient reports L sided CP/chest wall pain x 10 days, states he fell (mechanical) and the corner of the staircase banister caught him right in the chest - bruising noted. Patient endorses worsened pain since, worse with movement or lifting arms, denies SOB, no dizziness, no N/V.

## 2017-07-16 NOTE — ED Notes (Signed)
Patient verbalized understanding of discharge instructions and denies any further needs or questions at this time. VS stable. Patient ambulatory with steady gait.  

## 2017-07-16 NOTE — ED Provider Notes (Signed)
[facility] EMERGENCY DEPARTMENT Provider Note   CSN: [id number] Arrival date & time: 07/16/17  1032     History   Chief Complaint No chief complaint on file.   HPI Rodney Santos is a 62 y.o. male.  HPI  62 year old male presents with left-sided chest pain.  About 10 days ago he slipped and fell and hit the corner of stairs on his left chest.  Is been having pain since.  It is dull and throbbing.  Feels different than his anginal pain.  He has noticed bruising that has improved over his left chest.  Any type of movement, especially putting his arms above his head makes it worse.  There is no shortness of breath.  Has not taken anything for the pain.  Because the pain is still hurting he went to get an x-ray to make sure his ribs are not broken.  He states he has had anginal pain before and this does not feel similar.   Past Medical History:  Diagnosis Date  . Coronary artery disease   . Hypertension     There are no active problems to display for this patient.   History reviewed. No pertinent surgical history.      Home Medications    Prior to Admission medications   Not on File    Family History No family history on file.  Social History Social History   Tobacco Use  . Smoking status: Not on file  Substance Use Topics  . Alcohol use: Not on file  . Drug use: Not on file     Allergies   Patient has no known allergies.   Review of Systems Review of Systems  Constitutional: Negative for fever.  Respiratory: Negative for shortness of breath.   Cardiovascular: Positive for chest pain.  Musculoskeletal: Positive for back pain (left sided).  All other systems reviewed and are negative.    Physical Exam Updated Vital Signs BP 115/82   Pulse (!) 55   Temp 98.6 F (37 C) (Oral)   Resp 18   SpO2 96%   Physical Exam  Constitutional: He is oriented to person, place, and time. He appears well-developed and well-nourished. No  distress.  HENT:  Head: Normocephalic and atraumatic.  Right Ear: External ear normal.  Left Ear: External ear normal.  Nose: Nose normal.  Eyes: Right eye exhibits no discharge. Left eye exhibits no discharge.  Neck: Neck supple.  Cardiovascular: Normal rate, regular rhythm and normal heart sounds.  Pulmonary/Chest: Effort normal and breath sounds normal. He exhibits tenderness.  Mild ecchymosis with mild tenderness to left anterior chest.    Abdominal: Soft. There is no tenderness.  Musculoskeletal: He exhibits no edema.  Neurological: He is alert and oriented to person, place, and time.  Skin: Skin is warm and dry. He is not diaphoretic.  Nursing note and vitals reviewed.    ED Treatments / Results  Labs (all labs ordered are listed, but only abnormal results are displayed) Labs Reviewed  BASIC METABOLIC PANEL - Abnormal; Notable for the following components:      Result Value   Glucose, Bld 106 (*)    All other components within normal limits  CBC  PROTIME-INR  I-STAT TROPONIN, ED    EKG EKG Interpretation  Date/Time:  Thursday Jul 16 2017 10:48:36 EDT Ventricular Rate:  67 PR Interval:  174 QRS Duration: 90 QT Interval:  398 QTC Calculation: 420 R Axis:   0 Text Interpretation:  Normal  sinus rhythm no acute ST/T changes No old tracing to compare Confirmed by Pricilla Loveless [phone]) on 07/16/2017 4:50:47 PM   Radiology Dg Chest 2 View  Result Date: 07/16/2017 CLINICAL DATA:  Left-sided chest pain. EXAM: CHEST - 2 VIEW COMPARISON:  No prior. FINDINGS: Mediastinum hilar structures normal. Low lung volumes with mild basilar atelectasis. Tiny nodular opacities most likely calcified and consistent with old granulomas. No pleural effusion or pneumothorax. No acute bony abnormality. IMPRESSION: No acute cardiopulmonary disease. Electronically Signed   By: Maisie Fus  Register   On: 07/16/2017 11:40    Procedures Procedures (including critical care time)  Medications  Ordered in ED Medications - No data to display   Initial Impression / Assessment and Plan / ED Course  I have reviewed the triage vital signs and the nursing notes.  Pertinent labs & imaging results that were available during my care of the patient were reviewed by me and considered in my medical decision making (see chart for details).     Patient appears to have a chest wall contusion.  X-ray does not show an obvious fracture and certainly no pneumothorax.  I highly doubt this is cardiac given the continuous pain for over 1 weeks since the injury.  Advised to use Tylenol and ice as needed.  Given low suspicion for fracture or other acute chest pathology I do not think further work-up needed such as CT or cardiac work-up.  Labs were obtained in triage based on the chief complaint of chest pain but this does not seem cardiac in no further testing warranted in the ER.  Final Clinical Impressions(s) / ED Diagnoses   Final diagnoses:  Contusion of left chest wall, initial encounter    ED Discharge Orders    None       Pricilla Loveless, MD 07/16/17 2049

## 2017-07-17 ENCOUNTER — Encounter: Payer: Self-pay | Admitting: Cardiology

## 2017-10-12 ENCOUNTER — Other Ambulatory Visit: Payer: Self-pay

## 2017-10-19 ENCOUNTER — Other Ambulatory Visit: Payer: Self-pay

## 2018-01-13 ENCOUNTER — Emergency Department (HOSPITAL_COMMUNITY)
Admission: EM | Admit: 2018-01-13 | Discharge: 2018-01-13 | Disposition: A | Discharge status: Home / Self Care | Payer: No Typology Code available for payment source | Attending: Emergency Medicine | Admitting: Emergency Medicine

## 2018-01-13 ENCOUNTER — Encounter (HOSPITAL_COMMUNITY): Payer: Self-pay | Admitting: *Deleted

## 2018-01-13 ENCOUNTER — Emergency Department (HOSPITAL_COMMUNITY): Payer: No Typology Code available for payment source

## 2018-01-13 DIAGNOSIS — W108XXA Fall (on) (from) other stairs and steps, initial encounter: Secondary | ICD-10-CM | POA: Insufficient documentation

## 2018-01-13 DIAGNOSIS — Y998 Other external cause status: Secondary | ICD-10-CM | POA: Diagnosis not present

## 2018-01-13 DIAGNOSIS — I10 Essential (primary) hypertension: Secondary | ICD-10-CM | POA: Diagnosis not present

## 2018-01-13 DIAGNOSIS — Z7982 Long term (current) use of aspirin: Secondary | ICD-10-CM | POA: Diagnosis not present

## 2018-01-13 DIAGNOSIS — Y9301 Activity, walking, marching and hiking: Secondary | ICD-10-CM | POA: Diagnosis not present

## 2018-01-13 DIAGNOSIS — I2510 Atherosclerotic heart disease of native coronary artery without angina pectoris: Secondary | ICD-10-CM | POA: Diagnosis not present

## 2018-01-13 DIAGNOSIS — Z7902 Long term (current) use of antithrombotics/antiplatelets: Secondary | ICD-10-CM | POA: Diagnosis not present

## 2018-01-13 DIAGNOSIS — S3992XA Unspecified injury of lower back, initial encounter: Secondary | ICD-10-CM | POA: Diagnosis present

## 2018-01-13 DIAGNOSIS — S300XXA Contusion of lower back and pelvis, initial encounter: Secondary | ICD-10-CM | POA: Diagnosis not present

## 2018-01-13 DIAGNOSIS — Z79899 Other long term (current) drug therapy: Secondary | ICD-10-CM | POA: Diagnosis not present

## 2018-01-13 DIAGNOSIS — Y92812 Truck as the place of occurrence of the external cause: Secondary | ICD-10-CM | POA: Insufficient documentation

## 2018-01-13 MED ORDER — ONDANSETRON 4 MG PO TBDP: 4.0000 mg | ORAL_TABLET | Freq: Once | ORAL | Status: DC

## 2018-01-13 MED ORDER — HYDROCODONE-ACETAMINOPHEN 5-325 MG PO TABS: 1.0000 | ORAL_TABLET | Freq: Once | ORAL | Status: DC

## 2018-01-13 NOTE — ED Provider Notes (Signed)
MSE was initiated and I personally evaluated the patient and placed orders (if any) at  11:25 AM on January 13, 2018.  The patient appears stable so that the remainder of the MSE may be completed by another provider.    Chief Complaint: Fall/ Swelling   HPI: 62 year old male who presents with chief complaint of fall.  Patient was working today when his feet slipped out from underneath him and he fell approximately 3 feet landing on the corner of a battery which jammed into his right gluteal region..  Severe pain however over the next hour had increasing and significant swelling in the right cheek along with development of pain and tingling radiating down the right leg in a sciatic nerve distribution.  He also complains of significant pain in his lumbar spine region on the right.  He denies weakness or numbness and is ambulatory.  He is status post stent placement and is taking both Plavix and Eliquis.  ROS: Positive for back pain, right gluteal pain and swelling, paresthesia and pain down the right leg (one)  Physical Exam:   Gen: No distress  Neuro: Awake and Alert  Skin: Warm    Focused Exam: Patient has significant swelling in the right gluteal region suggestive of rapidly expanding hematoma.  He has normal strength and is ambulatory.    Initiation of care has begun. The patient has been counseled on the process, plan, and necessity for staying for the completion/evaluation, and the remainder of the medical screening examination    Arthor Captain, PA-C 01/13/18 1130    Linwood Dibbles, MD 01/15/18 [phone]

## 2018-01-13 NOTE — ED Triage Notes (Addendum)
Pt in after falling out of a truck and landing on his right buttocks area, swelling noted, also c/o pain to his right calf and tingling to his lower leg and foot, increased pain with walking, also lower back pain, denies hitting head, takes blood thinners, ambulatory to room- fall was approx 1-2 ft

## 2018-01-13 NOTE — ED Notes (Signed)
Patient verbalizes understanding of discharge instructions. Opportunity for questioning and answers were provided. Ambulatory at discharge in NAD.  

## 2018-01-13 NOTE — ED Provider Notes (Signed)
[facility] EMERGENCY DEPARTMENT Provider Note   CSN: [id number] Arrival date & time: 01/13/18  1047     History   Chief Complaint Chief Complaint  Patient presents with  . Fall    HPI Rodney Santos is a 62 y.o. male.  HPI   62 year old male history of coronary artery disease, hypertension, hyperlipidemia, on Plavix presents today complaining of injury to right buttock.  He was climbing down from a semitruck when he lost his footing and fell to the ground landing in a sitting position.  On the way down he struck the right side of his buttock on a corner of a battery.  He denies hitting his head, like loss of consciousness, abrasion, chest, neck, or abdominal injury.  He was able to get up and had been ambulatory for about an hour.  He noticed increased swelling in the right buttock with increased pain.  He also has noted pain down into his right calf.  He has not noted any weakness.  He presents for my evaluation after having initially been seen by a provider in triage.  He had x-rays obtained of his lumbar spine, sacrum, and right hip. Past Medical History:  Diagnosis Date  . Anxiety   . CAD (coronary artery disease) 04/2017   05/08/17: LHC DES mid LAD, nonobst dz circ, RCA, prox LAD  . Coronary artery disease   . Dyslipidemia 04/2017   LDL 129  . Hypertension   . Lung nodule 04/2017   4 mm LLL  . Thoracic aortic aneurysm (HCC) 04/2017   4.4 cm by CT [facility])    Patient Active Problem List   Diagnosis Date Noted  . Status post coronary artery stent placement   . Dyslipidemia 05/08/2017  . Unstable angina (HCC)   . Chest pain with moderate risk of acute coronary syndrome 05/07/2017  . Aortic aneurysm (HCC) 05/07/2017  . Lung nodule 05/07/2017  . CAD (coronary artery disease) 04/17/2017    Past Surgical History:  Procedure Laterality Date  . CORONARY STENT INTERVENTION N/A 05/08/2017   Procedure: CORONARY STENT INTERVENTION;  Surgeon: Kathleene Hazel, MD;  Location: [facility] INVASIVE CV LAB;  Service: Cardiovascular;  Laterality: N/A;  . HERNIA REPAIR    . LEFT HEART CATH AND CORONARY ANGIOGRAPHY N/A 05/08/2017   Procedure: LEFT HEART CATH AND CORONARY ANGIOGRAPHY;  Surgeon: Kathleene Hazel, MD;  Location: [facility] INVASIVE CV LAB;  Service: Cardiovascular;  Laterality: N/A;  . THORACIC AORTOGRAM N/A 05/08/2017   Procedure: THORACIC AORTOGRAM;  Surgeon: Kathleene Hazel, MD;  Location: [facility] INVASIVE CV LAB;  Service: Cardiovascular;  Laterality: N/A;        Home Medications    Prior to Admission medications   Medication Sig Start Date End Date Taking? Authorizing Provider  aspirin EC 81 MG EC tablet Take 1 tablet (81 mg total) by mouth daily. 05/09/17   Briant Cedar, MD  atorvastatin (LIPITOR) 40 MG tablet Take 1 tablet (40 mg total) by mouth daily at 6 PM. 06/22/17   Berton Bon, NP  clopidogrel (PLAVIX) 75 MG tablet Take 1 tablet (75 mg total) by mouth daily with breakfast. 05/09/17   Briant Cedar, MD  metoprolol tartrate (LOPRESSOR) 25 MG tablet Take 0.5 tablets (12.5 mg total) by mouth 2 (two) times daily. 05/09/17   Briant Cedar, MD  nitroGLYCERIN (NITROSTAT) 0.4 MG SL tablet Place 1 tablet (0.4 mg total) under the tongue every 5 (five) minutes as needed for chest  pain. 05/09/17   Briant Cedar, MD  pantoprazole (PROTONIX) 40 MG tablet Take 1 tablet (40 mg total) by mouth daily. 05/09/17   Briant Cedar, MD  Turmeric 500 MG TABS Take 1,000 mg by mouth daily.    [provider]    Family History Family History  Problem Relation Age of Onset  . Aortic aneurysm Mother   . Hypertension Father     Social History Social History   Tobacco Use  . Smoking status: Never Smoker  Substance Use Topics  . Alcohol use: Yes  . Drug use: No     Allergies   Penicillins   Review of Systems Review of Systems  All other systems reviewed and are negative.    Physical  Exam Updated Vital Signs BP (!) 131/99 (BP Location: Right Arm)   Pulse 78   Temp 98 F (36.7 C) (Oral)   Resp 20   SpO2 100%   Physical Exam  Constitutional: He is oriented to person, place, and time. He appears well-developed and well-nourished. No distress.  HENT:  Head: Normocephalic.  Right Ear: External ear normal.  Left Ear: External ear normal.  Nose: Nose normal.  Mouth/Throat: Oropharynx is clear and moist.  Eyes: Pupils are equal, round, and reactive to light. EOM are normal.  Neck: Normal range of motion. Neck supple.  Cardiovascular: Normal rate, regular rhythm and normal heart sounds.  Pulmonary/Chest: Effort normal and breath sounds normal.  Abdominal: Soft. Bowel sounds are normal.  Musculoskeletal: Normal range of motion.  No signs of trauma to cervical, thoracic, lumbar spine, or back. Full active range of motion of shoulders, elbows, and wrists bilaterally Right buttock with firm somewhat small tender swelling compared to left side Full active range of motion bilateral hips No tenderness palpation bilateral knees, upper legs, lower legs, ankles, or feet Dorsal talus pulses intact bilaterally  Neurological: He is alert and oriented to person, place, and time.  Skin: Skin is warm and dry. Capillary refill takes less than 2 seconds.  Psychiatric: He has a normal mood and affect. His behavior is normal. Thought content normal.  Nursing note and vitals reviewed.    ED Treatments / Results  Labs (all labs ordered are listed, but only abnormal results are displayed) Labs Reviewed  BASIC METABOLIC PANEL  CBC WITH DIFFERENTIAL/PLATELET    EKG None  Radiology Dg Lumbar Spine Complete  Result Date: 01/13/2018 CLINICAL DATA:  Larey Seat from a [employer] truck. EXAM: LUMBAR SPINE - COMPLETE 4+ VIEW COMPARISON:  None. FINDINGS: Five lumbar type vertebral bodies. No acute fracture or subluxation. Vertebral body heights are preserved. Alignment is normal. Mild disc height  loss and endplate spurring at L1-L2 and L5-S1. Mild facet arthropathy at L5-S1. IMPRESSION: 1.  No acute osseous abnormality. 2. Mild lumbar spondylosis. Electronically Signed   By: Obie Dredge M.D.   On: 01/13/2018 12:10   Dg Sacrum/coccyx  Result Date: 01/13/2018 CLINICAL DATA:  Larey Seat from [employer] truck. EXAM: SACRUM AND COCCYX - 2+ VIEW COMPARISON:  None. FINDINGS: There is no evidence of fracture or other focal bone lesions. The pubic symphysis and sacroiliac joints are intact. The bilateral hip joints are unremarkable. Soft tissues are unremarkable. IMPRESSION: Negative. Electronically Signed   By: Obie Dredge M.D.   On: 01/13/2018 12:08   Dg Hip Unilat With Pelvis 2-3 Views Right  Result Date: 01/13/2018 CLINICAL DATA:  Larey Seat from a [employer] truck. EXAM: DG HIP (WITH OR WITHOUT PELVIS) 2-3V RIGHT COMPARISON:  None. FINDINGS:  There is no evidence of hip fracture or dislocation. There is no evidence of arthropathy or other focal bone abnormality. IMPRESSION: Negative. Electronically Signed   By: Obie Dredge M.D.   On: 01/13/2018 12:11    Procedures Procedures (including critical care time)  Medications Ordered in ED Medications  HYDROcodone-acetaminophen (NORCO/VICODIN) 5-325 MG per tablet 1 tablet (has no administration in time range)  ondansetron (ZOFRAN-ODT) disintegrating tablet 4 mg (has no administration in time range)     Initial Impression / Assessment and Plan / ED Course  I have reviewed the triage vital signs and the nursing notes.  Pertinent labs & imaging results that were available during my care of the patient were reviewed by me and considered in my medical decision making (see chart for details).     Patient fell 3 to 4 feet in a sitting position from truck.  He has swelling in the right gluteal area.  No evidence of fracture on hip, lumbar or sacral x-rays.  He has some pain in the right calf which I suspect is due to some radiculopathy due to swelling in the  gluteal region.  He appears to have a hematoma.  Plan warm and cold therapy with Tylenol for pain.  He was at work and this is work-related.  He will be referred to follow-up in 1 to 2 days with occupational health   Final Clinical Impressions(s) / ED Diagnoses   Final diagnoses:  Fall (on) (from) other stairs and steps, initial encounter  Traumatic hematoma of buttock, initial encounter    ED Discharge Orders    None       Margarita Grizzle, MD 01/13/18 1317

## 2018-01-13 NOTE — Discharge Instructions (Addendum)
Please use hot and cold therapy for pain.  Recheck with doctor tomorrow. Use tylenol as needed for pain.

## 2018-05-12 ENCOUNTER — Encounter: Payer: Self-pay | Admitting: Cardiovascular Disease

## 2018-05-12 ENCOUNTER — Ambulatory Visit: Payer: BLUE CROSS/BLUE SHIELD | Admitting: Cardiovascular Disease

## 2018-05-12 VITALS — BP 118/78 | HR 83 | Ht 70.0 in | Wt 217.4 lb

## 2018-05-12 DIAGNOSIS — I712 Thoracic aortic aneurysm, without rupture, unspecified: Secondary | ICD-10-CM

## 2018-05-12 DIAGNOSIS — I2510 Atherosclerotic heart disease of native coronary artery without angina pectoris: Secondary | ICD-10-CM

## 2018-05-12 DIAGNOSIS — I2583 Coronary atherosclerosis due to lipid rich plaque: Secondary | ICD-10-CM | POA: Diagnosis not present

## 2018-05-12 LAB — BASIC METABOLIC PANEL
BUN/Creatinine Ratio: 12 (ref 10–24)
BUN: 13 mg/dL (ref 8–27)
CO2: 21 mmol/L (ref 20–29)
Calcium: 9.4 mg/dL (ref 8.6–10.2)
Chloride: 106 mmol/L (ref 96–106)
Creatinine, Ser: 1.06 mg/dL (ref 0.76–1.27)
GFR calc Af Amer: 88 mL/min/{1.73_m2} (ref >59)
GFR calc non Af Amer: 76 mL/min/{1.73_m2} (ref >59)
Glucose: 93 mg/dL (ref 65–99)
Potassium: 4.3 mmol/L (ref 3.5–5.2)
Sodium: 142 mmol/L (ref 134–144)

## 2018-05-12 MED ORDER — METOPROLOL SUCCINATE ER 25 MG PO TB24: 25.0000 mg | ORAL_TABLET | Freq: Every day | ORAL | 3 refills | Status: DC

## 2018-05-12 NOTE — Progress Notes (Signed)
Cardiology Office Note:    Date:  05/12/2018   ID:  Rodney Santos, DOB 1955-08-02, MRN [id number]  PCP:  Pearson Grippe, MD  Cardiologist:  Kristeen Miss, MD  Electrophysiologist:  None   Referring MD: No ref. provider found   Problem list 1.  Ascending aortic aneurysm-4.4 cm 2.  Hyperlipidemia 3.  Coronary artery disease, status post PCI, March 2019   Chief Complaint  Patient presents with  . Hyperlipidemia     Feb. 26, 2020    Rodney Santos is a 63 y.o. male with a hx of coronary artery disease and hyperlipidemia.  He was recently found to have an ascending aortic aneurysm.  The aneurysm was identified incidentally during a coronary calcium score CT  Scan.  He has continued to have some chest pain / tearing sensation  In retrospect, this seems to be related to an energy drink he was taking [employer] ) ,  He was taking an excessive amount of supplements.   He does not get any cardio - is active as a   Mother passed away at age 43 of ruptured asc. Aortic aneurism.   No angin a    Past Medical History:  Diagnosis Date  . Anxiety   . CAD (coronary artery disease) 04/2017   05/08/17: LHC DES mid LAD, nonobst dz circ, RCA, prox LAD  . Coronary artery disease   . Dyslipidemia 04/2017   LDL 129  . Hypertension   . Lung nodule 04/2017   4 mm LLL  . Thoracic aortic aneurysm (HCC) 04/2017   4.4 cm by CT [facility])    Past Surgical History:  Procedure Laterality Date  . CORONARY STENT INTERVENTION N/A 05/08/2017   Procedure: CORONARY STENT INTERVENTION;  Surgeon: Kathleene Hazel, MD;  Location: [facility] INVASIVE CV LAB;  Service: Cardiovascular;  Laterality: N/A;  . HERNIA REPAIR    . LEFT HEART CATH AND CORONARY ANGIOGRAPHY N/A 05/08/2017   Procedure: LEFT HEART CATH AND CORONARY ANGIOGRAPHY;  Surgeon: Kathleene Hazel, MD;  Location: [facility] INVASIVE CV LAB;  Service: Cardiovascular;  Laterality: N/A;  . THORACIC AORTOGRAM N/A 05/08/2017   Procedure: THORACIC  AORTOGRAM;  Surgeon: Kathleene Hazel, MD;  Location: [facility] INVASIVE CV LAB;  Service: Cardiovascular;  Laterality: N/A;    Current Medications: Current Meds  Medication Sig  . atorvastatin (LIPITOR) 80 MG tablet Take 80 mg by mouth daily at 6 PM.   . clopidogrel (PLAVIX) 75 MG tablet Take 1 tablet (75 mg total) by mouth daily with breakfast.  . nitroGLYCERIN (NITROSTAT) 0.4 MG SL tablet Place 1 tablet (0.4 mg total) under the tongue every 5 (five) minutes as needed for chest pain.  . pantoprazole (PROTONIX) 40 MG tablet Take 1 tablet (40 mg total) by mouth daily.  . Turmeric 500 MG TABS Take 1,000 mg by mouth daily.  . [DISCONTINUED] aspirin EC 81 MG EC tablet Take 1 tablet (81 mg total) by mouth daily.  . [DISCONTINUED] metoprolol tartrate (LOPRESSOR) 25 MG tablet Take 0.5 tablets (12.5 mg total) by mouth 2 (two) times daily.     Allergies:   Penicillins   Social History   Socioeconomic History  . Marital status: Married    Spouse name: Not on file  . Number of children: Not on file  . Years of education: Not on file  . Highest education level: Not on file  Occupational History  . Not on file  Social Needs  . Financial resource strain: Not  on file  . Food insecurity:    Worry: Not on file    Inability: Not on file  . Transportation needs:    Medical: Not on file    Non-medical: Not on file  Tobacco Use  . Smoking status: Never Smoker  Substance and Sexual Activity  . Alcohol use: Yes  . Drug use: No  . Sexual activity: Not on file  Lifestyle  . Physical activity:    Days per week: Not on file    Minutes per session: Not on file  . Stress: Not on file  Relationships  . Social connections:    Talks on phone: Not on file    Gets together: Not on file    Attends religious service: Not on file    Active member of club or organization: Not on file    Attends meetings of clubs or organizations: Not on file    Relationship status: Not on file  Other Topics Concern    . Not on file  Social History Narrative   ** Merged History Encounter **         Family History: The patient's family history includes Aortic aneurysm in his mother; Hypertension in his father.  ROS:   Please see the history of present illness.     All other systems reviewed and are negative.  EKGs/Labs/Other Studies Reviewed:    The following studies were reviewed today:   EKG: May 12, 2018: Normal sinus rhythm at 75.  Normal EKG.  Recent Labs: 06/17/2017: ALT 35 07/16/2017: BUN 15; Creatinine, Ser 1.07; Hemoglobin 15.2; Platelets 256; Potassium 4.0; Sodium 139  Recent Lipid Panel    Component Value Date/Time   CHOL 103 06/17/2017 0813   TRIG 94 06/17/2017 0813   HDL 31 (L) 06/17/2017 0813   CHOLHDL 3.3 06/17/2017 0813   CHOLHDL 8.2 05/08/2017 0503   VLDL 57 (H) 05/08/2017 0503   LDLCALC 53 06/17/2017 0813    Physical Exam:    VS:  BP 118/78   Pulse 83   Ht  (1.778 m)   Wt 217 lb 6.4 oz (98.6 kg)   SpO2 97%   BMI 31.19 kg/m     Wt Readings from Last 3 Encounters:  05/12/18 217 lb 6.4 oz (98.6 kg)  05/19/17 207 lb (93.9 kg)  05/09/17 215 lb 2.7 oz (97.6 kg)     GEN:  Well nourished, well developed in no acute distress HEENT: Normal NECK: No JVD; No carotid bruits LYMPHATICS: No lymphadenopathy CARDIAC: RRR, no murmurs, rubs, gallops RESPIRATORY:  Clear to auscultation without rales, wheezing or rhonchi  ABDOMEN: Soft, non-tender, non-distended MUSCULOSKELETAL:  No edema; No deformity  SKIN: Warm and dry NEUROLOGIC:  Alert and oriented x 3 PSYCHIATRIC:  Normal affect   ASSESSMENT:    1. Coronary artery disease due to lipid rich plaque   2. Thoracic aortic aneurysm without rupture (HCC)    PLAN:    In order of problems listed above:  1. 1.  Coronary artery disease: Rodney Santos presents today for further evaluation of coronary artery disease.  He had stenting of his mid left anterior descending artery last year.  It is been 1 year since his PCI.   At this time we will discontinue the aspirin.  We will continue Plavix.  He is not having any episodes of angina.  2.  Hyperlipidemia: He is on atorvastatin 80 mg a day.  His last LDL level from his primary medical doctor's office was 59.  We  will plan on rechecking a lipid profile, liver enzymes, basic metabolic profile in 1 year.  3.  Ascending aortic aneurysm: The patient was incidentally found to have a 4.4 cm aortic aneurysm by CT scan.  He has seen a Careers adviser in [place].  It is been 1 year since his last evaluation of his a sending aortic aneurysm.  We will get a CT angiogram of his aorta.  If it is grown significantly or gets any larger, will refer him to the CV surgeons.   Medication Adjustments/Labs and Tests Ordered: Current medicines are reviewed at length with the patient today.  Concerns regarding medicines are outlined above.  Orders Placed This Encounter  Procedures  . CT ANGIO CHEST AORTA W &/OR WO CONTRAST  . Basic metabolic panel  . Basic metabolic panel  . Hepatic function panel  . Lipid panel  . EKG 12-Lead   Meds ordered this encounter  Medications  . metoprolol succinate (TOPROL-XL) 25 MG 24 hr tablet    Sig: Take 1 tablet (25 mg total) by mouth at bedtime.    Dispense:  90 tablet    Refill:  3    Replaces metoprolol tartrate    Patient Instructions  Medication Instructions:  Your physician has recommended you make the following change in your medication:  1.) stop aspirin 2.) stop metoprolol tartrate (Lopressor) 3.) start metoprolol succinate (Toprol XL) 25 mg once a day at bedtime  If you need a refill on your cardiac medications before your next appointment, please call your pharmacy.   Lab work: Today: BMET, Prior to next year's visit -bmet, lipids, lft If you have labs (blood work) drawn today and your tests are completely normal, you will receive your results only by: Marland Kitchen MyChart Message (if you have MyChart) OR . A paper copy in the  mail If you have any lab test that is abnormal or we need to change your treatment, we will call you to review the results.  Testing/Procedures: Non-Cardiac CT scanning, (CAT scanning), is a noninvasive, special x-ray that produces cross-sectional images of the body using x-rays and a computer. CT scans help physicians diagnose and treat medical conditions. For some CT exams, a contrast material is used to enhance visibility in the area of the body being studied. CT scans provide greater clarity and reveal more details than regular x-ray exams.  Follow-Up: At [facility], you and your health needs are our priority.  As part of our continuing mission to provide you with exceptional heart care, we have created designated Provider Care Teams.  These Care Teams include your primary Cardiologist (physician) and Advanced Practice Providers (APPs -  Physician Assistants and Nurse Practitioners) who all work together to provide you with the care you need, when you need it. You will need a follow up appointment in:  12 months.  Please call our office 2 months in advance to schedule this appointment.  You may see Kristeen Miss, MD or one of the following Advanced Practice Providers on your designated Care Team: Tereso Newcomer, PA-C [place], [place] . Berton Bon, NP  Any Other Special Instructions Will Be Listed Below (If Applicable).       Signed, Kristeen Miss, MD  05/12/2018 10:30 AM    [facility]

## 2018-05-12 NOTE — Patient Instructions (Signed)
Medication Instructions:  Your physician has recommended you make the following change in your medication:  1.) stop aspirin 2.) stop metoprolol tartrate (Lopressor) 3.) start metoprolol succinate (Toprol XL) 25 mg once a day at bedtime  If you need a refill on your cardiac medications before your next appointment, please call your pharmacy.   Lab work: Today: BMET, Prior to next year's visit -bmet, lipids, lft If you have labs (blood work) drawn today and your tests are completely normal, you will receive your results only by: Marland Kitchen MyChart Message (if you have MyChart) OR . A paper copy in the mail If you have any lab test that is abnormal or we need to change your treatment, we will call you to review the results.  Testing/Procedures: Non-Cardiac CT scanning, (CAT scanning), is a noninvasive, special x-ray that produces cross-sectional images of the body using x-rays and a computer. CT scans help physicians diagnose and treat medical conditions. For some CT exams, a contrast material is used to enhance visibility in the area of the body being studied. CT scans provide greater clarity and reveal more details than regular x-ray exams.  Follow-Up: At [facility], you and your health needs are our priority.  As part of our continuing mission to provide you with exceptional heart care, we have created designated Provider Care Teams.  These Care Teams include your primary Cardiologist (physician) and Advanced Practice Providers (APPs -  Physician Assistants and Nurse Practitioners) who all work together to provide you with the care you need, when you need it. You will need a follow up appointment in:  12 months.  Please call our office 2 months in advance to schedule this appointment.  You may see Kristeen Miss, MD or one of the following Advanced Practice Providers on your designated Care Team: Tereso Newcomer, PA-C [place], [place] . Berton Bon, NP  Any Other Special Instructions Will Be  Listed Below (If Applicable).

## 2018-06-03 ENCOUNTER — Telehealth: Payer: Self-pay | Admitting: *Deleted

## 2018-06-03 NOTE — Telephone Encounter (Signed)
Per Marcelino Duster okay to CX CT due to COVID Restrictions. Spoke to patient 06/03/2018 at 10:17 AM Pt is not having any problems or issues at this time. Advised paitent we would call to resch FU CT.

## 2018-06-04 ENCOUNTER — Inpatient Hospital Stay: Admission: RE | Admit: 2018-06-04 | Payer: Self-pay | Source: Ambulatory Visit

## 2018-08-12 ENCOUNTER — Telehealth: Payer: Self-pay | Admitting: *Deleted

## 2018-08-12 NOTE — Telephone Encounter (Signed)

## 2018-08-13 ENCOUNTER — Other Ambulatory Visit: Payer: Self-pay

## 2018-08-13 ENCOUNTER — Ambulatory Visit (INDEPENDENT_AMBULATORY_CARE_PROVIDER_SITE_OTHER)
Admission: RE | Admit: 2018-08-13 | Discharge: 2018-08-13 | Disposition: A | Discharge status: Home / Self Care | Payer: BLUE CROSS/BLUE SHIELD | Source: Ambulatory Visit | Attending: Cardiovascular Disease | Admitting: Cardiovascular Disease

## 2018-08-13 DIAGNOSIS — I712 Thoracic aortic aneurysm, without rupture, unspecified: Secondary | ICD-10-CM

## 2018-08-13 DIAGNOSIS — I2583 Coronary atherosclerosis due to lipid rich plaque: Secondary | ICD-10-CM

## 2018-08-13 DIAGNOSIS — I2510 Atherosclerotic heart disease of native coronary artery without angina pectoris: Secondary | ICD-10-CM | POA: Diagnosis not present

## 2018-08-13 MED ORDER — IOHEXOL 350 MG/ML SOLN
100.0000 mL | Freq: Once | INTRAVENOUS | Status: AC | PRN
  Administered 2018-08-13: 100 mL via INTRAVENOUS

## 2018-09-26 IMAGING — DX DG CHEST 2V
2 series · 2 of 2 positions shown · non-contrast
Comparison: No prior.

CLINICAL DATA: Left-sided chest pain.

EXAM:
CHEST - 2 VIEW

[chest pa]
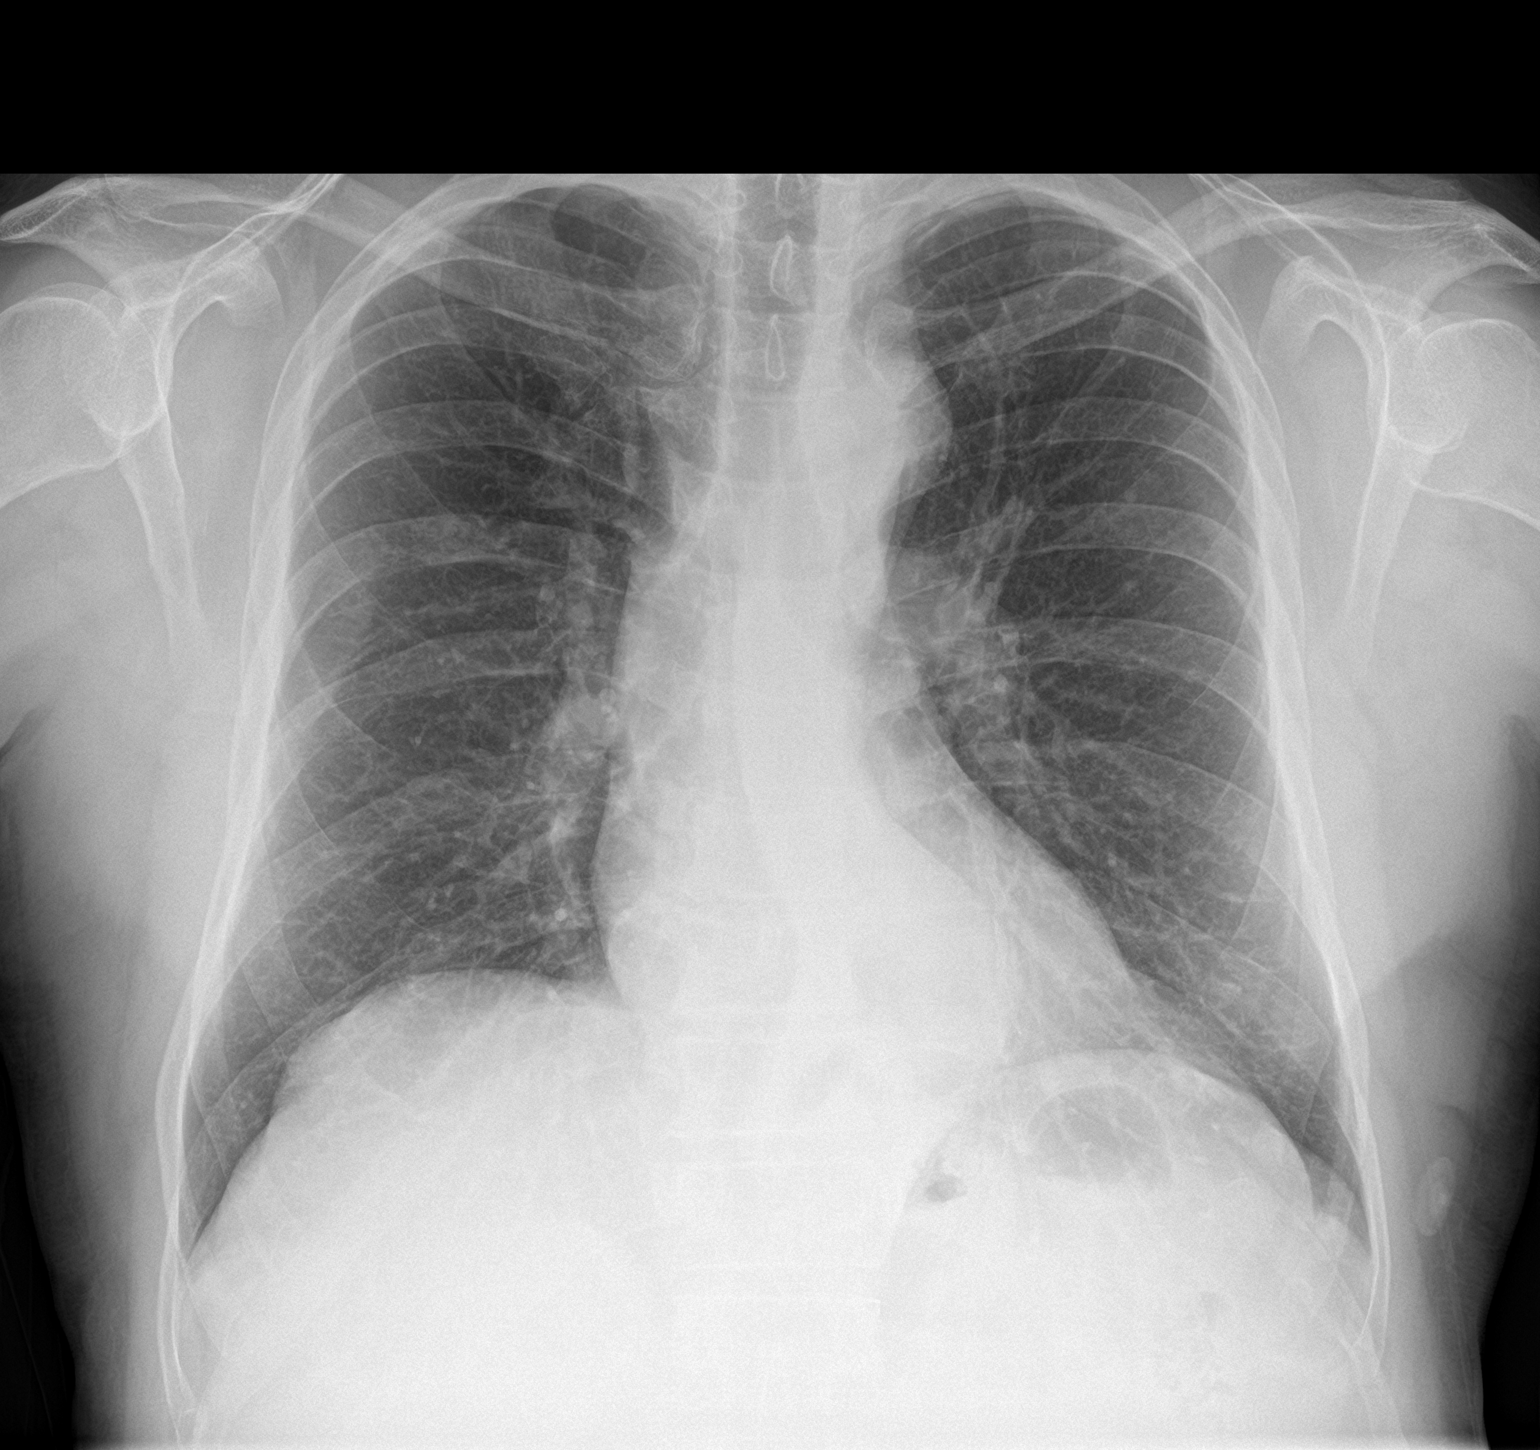

[chest lat]
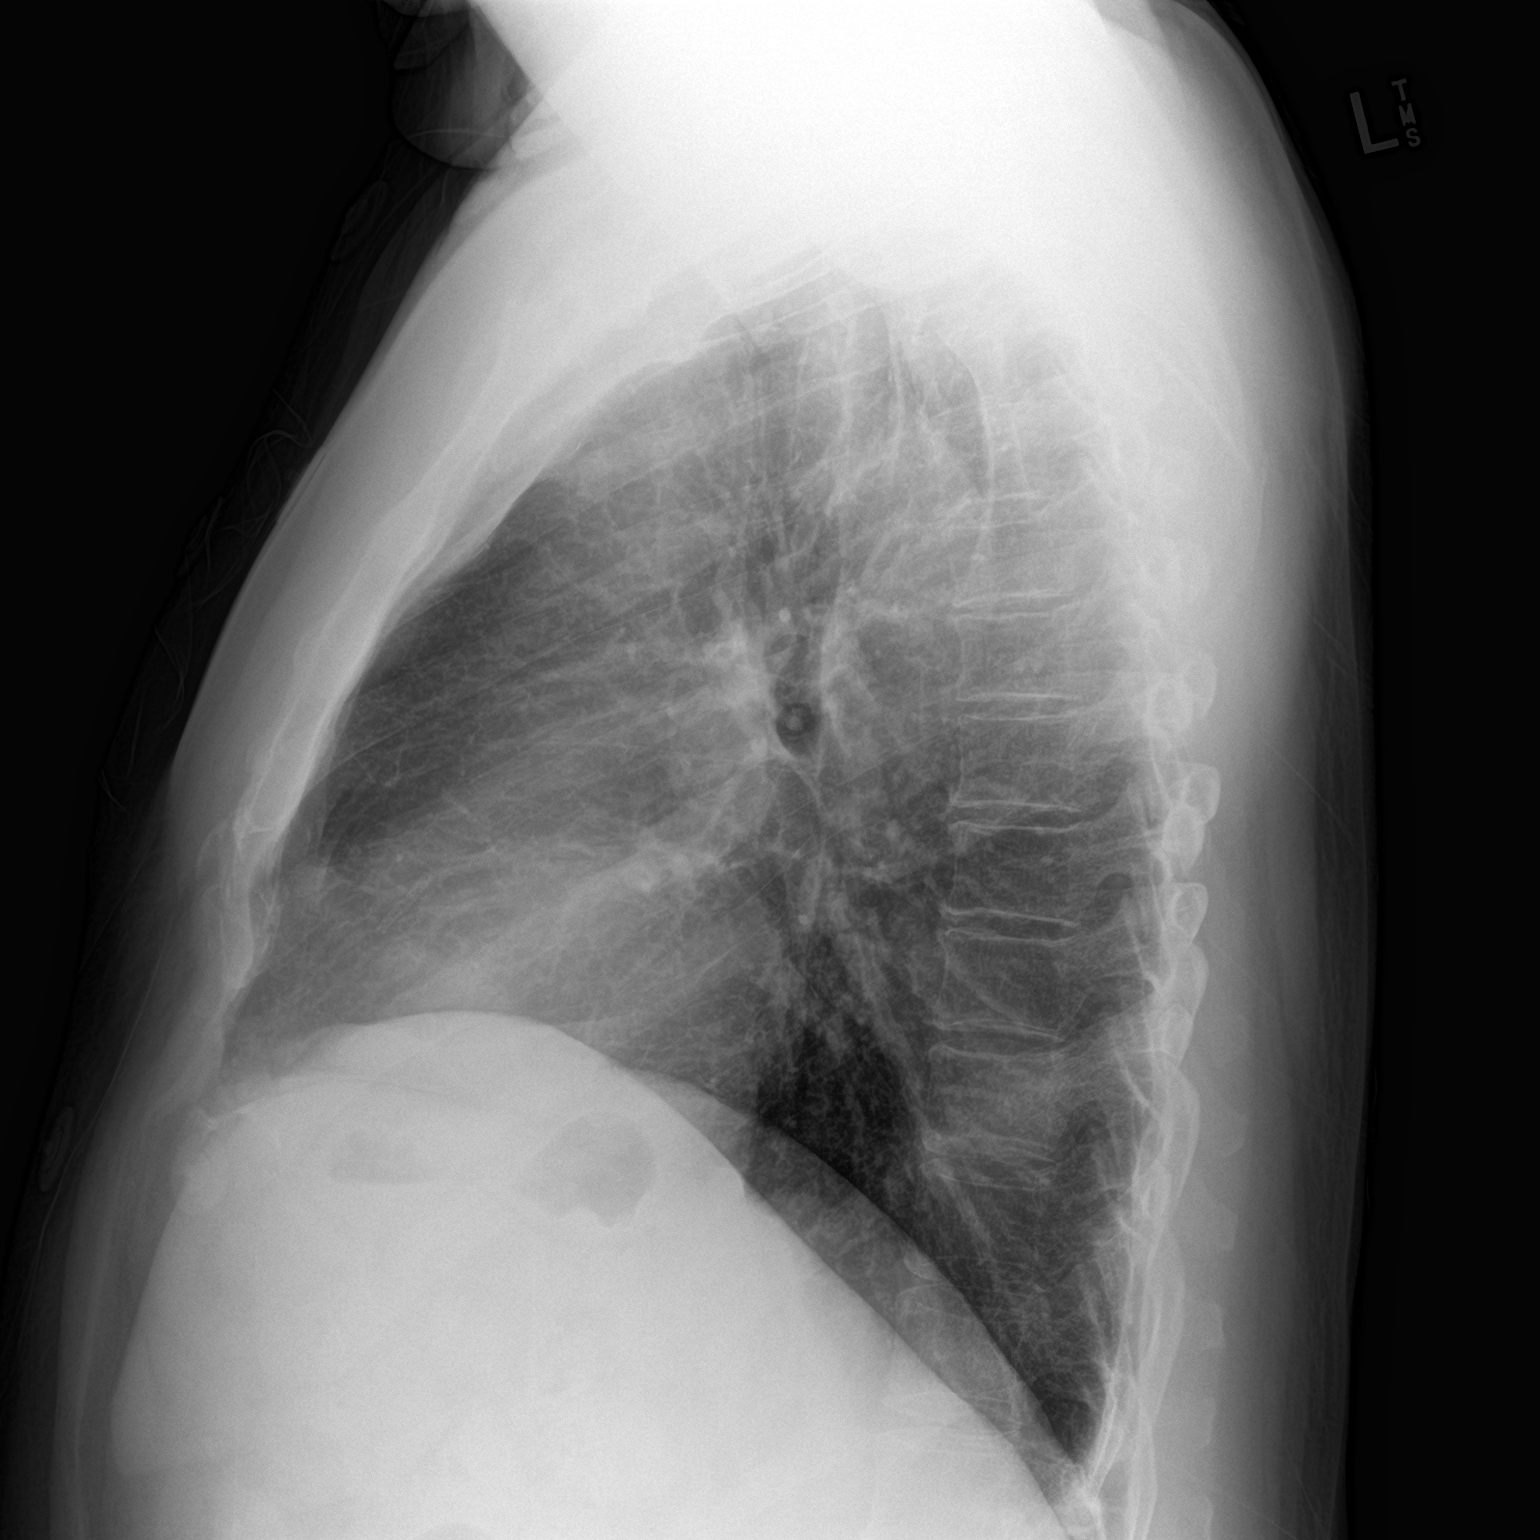

[2 of 2 positions shown; findings below may reference images not displayed]

FINDINGS: Mediastinum hilar structures normal. Low lung volumes with mild
basilar atelectasis. Tiny nodular opacities most likely calcified
and consistent with old granulomas. No pleural effusion or
pneumothorax. No acute bony abnormality.
IMPRESSION: No acute cardiopulmonary disease.

## 2018-10-08 ENCOUNTER — Other Ambulatory Visit: Payer: Self-pay | Admitting: Rheumatology

## 2018-10-08 DIAGNOSIS — M25561 Pain in right knee: Secondary | ICD-10-CM

## 2018-10-09 ENCOUNTER — Ambulatory Visit
Admission: RE | Admit: 2018-10-09 | Discharge: 2018-10-09 | Disposition: A | Discharge status: Home / Self Care | Payer: BLUE CROSS/BLUE SHIELD | Source: Ambulatory Visit | Attending: Rheumatology | Admitting: Rheumatology

## 2018-10-09 ENCOUNTER — Ambulatory Visit
Admission: RE | Admit: 2018-10-09 | Discharge: 2018-10-09 | Disposition: A | Discharge status: Home / Self Care | Payer: BC Managed Care – PPO | Source: Ambulatory Visit | Attending: Rheumatology | Admitting: Rheumatology

## 2018-10-09 ENCOUNTER — Other Ambulatory Visit: Payer: Self-pay | Admitting: Rheumatology

## 2018-10-09 DIAGNOSIS — M25561 Pain in right knee: Secondary | ICD-10-CM

## 2018-10-09 DIAGNOSIS — Z77018 Contact with and (suspected) exposure to other hazardous metals: Secondary | ICD-10-CM

## 2018-10-29 ENCOUNTER — Telehealth: Payer: Self-pay | Admitting: *Deleted

## 2018-10-29 NOTE — Telephone Encounter (Signed)
Dr Acie Fredrickson is it OK to hold Plavix in this patient for knee scope- he had an LAD DES Feb 2019.  Kerin Ransom PA-C 10/29/2018 10:54 AM

## 2018-10-29 NOTE — Telephone Encounter (Signed)
    [facility] Pre-operative Risk Assessment    Request for surgical clearance:  1. What type of surgery is being performed? KNEE SCOPE, MEDICAL MENISCUS, SUBCHONDRAL    2. When is this surgery scheduled? TBD   3. What type of clearance is required (medical clearance vs. Pharmacy clearance to hold med vs. Both)? MEDICAL  4. Are there any medications that need to be held prior to surgery and how long? PLAVIX    5. Practice name and name of physician performing surgery? [facility]; DR. Corene Cornea ROGERS   6. What is your office phone number [phone]    7.   What is your office fax number [phone]  8.   Anesthesia type (None, local, MAC, general) ? CHOICE   Rodney Santos 10/29/2018, 10:14 AM  _________________________________________________________________   (provider comments below)

## 2018-11-01 NOTE — Telephone Encounter (Signed)
He is now on Plavix and is not on ASA.  He may hold Plavix for 5 days prior to surgery and restart as soon as possible following the procedure  I would prefer that he take ASA 81 mg a day while off the Plavix if OK with Ortho.

## 2018-11-04 ENCOUNTER — Inpatient Hospital Stay: Admission: RE | Admit: 2018-11-04 | Payer: Self-pay | Source: Ambulatory Visit

## 2018-11-09 NOTE — Progress Notes (Signed)
[id number] - [place], [place] [address] Phone: [phone] Fax: [phone]      Your procedure is scheduled on November 16, 2018.  Report to [facility] Main Entrance "A" at 10:30 A.M., and check in at the Admitting office.  Call this number if you have problems the morning of surgery:  [phone]  Call [phone] if you have any questions prior to your surgery date Monday-Friday 8am-4pm    Remember:  Do not eat after midnight the night before your surgery  You may drink clear liquids until 09:30 the morning of your surgery.   Clear liquids allowed are: Water, Non-Citrus Juices (without pulp), Carbonated Beverages, Clear Tea, Black Coffee Only, and Gatorade.   Enhanced Recovery after Surgery for Orthopedics Enhanced Recovery after Surgery is a protocol used to improve the stress on your body and your recovery after surgery.  Patient Instructions  . The night before surgery:  o No food after midnight. ONLY clear liquids after midnight  .  Marland Kitchen The day of surgery (if you do NOT have diabetes):  o Drink ONE (1) Pre-Surgery Clear Ensure as directed.   o This drink was given to you during your hospital  pre-op appointment visit. o The pre-op nurse will instruct you on the time to drink the  Pre-Surgery Ensure depending on your surgery time. o Finish the drink at the designated time by the pre-op nurse.  o Nothing else to drink after completing the  Pre-Surgery Clear Ensure.     Take these medicines the morning of surgery with A SIP OF WATER : Metoprolol Succinate (Toprol-XL) Pantoprazole (Protonix) Atorvastatin (Lipitor) Nitroglycerin if needed  Per your Doctor, HOLD your PLAVIX for 5 DAYS prior to your surgery.  7 days prior to surgery STOP taking any Aspirin (unless otherwise instructed by your surgeon), Aleve, Naproxen, Ibuprofen, Motrin, Advil, Goody's, BC's, all herbal  medications, fish oil, and all vitamins.    The Morning of Surgery  Do not wear jewelry.  Do not wear lotions, powders, or perfumes/colognes, or deodorant  Do not shave 48 hours prior to surgery.  Men may shave face and neck.  Do not bring valuables to the hospital.  [facility] is not responsible for any belongings or valuables.  If you are a smoker, DO NOT Smoke 24 hours prior to surgery IF you wear a CPAP at night please bring your mask, tubing, and machine the morning of surgery   Remember that you must have someone to transport you home after your surgery, and remain with you for 24 hours if you are discharged the same day.   Contacts, glasses, hearing aids, dentures or bridgework may not be worn into surgery.    Leave your suitcase in the car.  After surgery it may be brought to your room.  For patients admitted to the hospital, discharge time will be determined by your treatment team.  Patients discharged the day of surgery will not be allowed to drive home.    Special instructions:   - Preparing For Surgery  Before surgery, you can play an important role. Because skin is not sterile, your skin needs to be as free of germs as possible. You can reduce the number of germs on your skin by washing with CHG (chlorahexidine gluconate) Soap before surgery.  CHG is an antiseptic cleaner which kills germs and bonds with the skin to continue killing germs even after washing.  Oral Hygiene is also important to reduce your risk of infection.  Remember - BRUSH YOUR TEETH THE MORNING OF SURGERY WITH YOUR REGULAR TOOTHPASTE  Please do not use if you have an allergy to CHG or antibacterial soaps. If your skin becomes reddened/irritated stop using the CHG.  Do not shave (including legs and underarms) for at least 48 hours prior to first CHG shower. It is OK to shave your face.  Please follow these instructions carefully.   1. Shower the NIGHT BEFORE SURGERY and the MORNING OF  SURGERY with CHG Soap.   2. If you chose to wash your hair, wash your hair first as usual with your normal shampoo.  3. After you shampoo, rinse your hair and body thoroughly to remove the shampoo.  4. Use CHG as you would any other liquid soap. You can apply CHG directly to the skin and wash gently with a scrungie or a clean washcloth.   5. Apply the CHG Soap to your body ONLY FROM THE NECK DOWN.  Do not use on open wounds or open sores. Avoid contact with your eyes, ears, mouth and genitals (private parts). Wash Face and genitals (private parts)  with your normal soap.   6. Wash thoroughly, paying special attention to the area where your surgery will be performed.  7. Thoroughly rinse your body with warm water from the neck down.  8. DO NOT shower/wash with your normal soap after using and rinsing off the CHG Soap.  9. Pat yourself dry with a CLEAN TOWEL.  10. Wear CLEAN PAJAMAS to bed the night before surgery, wear comfortable clothes the morning of surgery  11. Place CLEAN SHEETS on your bed the night of your first shower and DO NOT SLEEP WITH PETS.    Day of Surgery:  Do not apply any deodorants/lotions. Please shower the morning of surgery with the CHG soap  Please wear clean clothes to the hospital/surgery center.   Remember to brush your teeth WITH YOUR REGULAR TOOTHPASTE.   Please read over the following fact sheets that you were given.

## 2018-11-10 ENCOUNTER — Encounter (HOSPITAL_COMMUNITY): Payer: Self-pay

## 2018-11-10 ENCOUNTER — Encounter (HOSPITAL_COMMUNITY)
Admission: RE | Admit: 2018-11-10 | Discharge: 2018-11-10 | Disposition: A | Discharge status: Home / Self Care | Payer: BC Managed Care – PPO | Source: Ambulatory Visit | Attending: Orthopedic Surgery | Admitting: Orthopedic Surgery

## 2018-11-10 ENCOUNTER — Other Ambulatory Visit: Payer: Self-pay

## 2018-11-10 DIAGNOSIS — S83241A Other tear of medial meniscus, current injury, right knee, initial encounter: Secondary | ICD-10-CM | POA: Diagnosis not present

## 2018-11-10 DIAGNOSIS — Z01812 Encounter for preprocedural laboratory examination: Secondary | ICD-10-CM | POA: Diagnosis not present

## 2018-11-10 HISTORY — DX: Accidental discharge from unspecified firearms or gun, initial encounter: W34.00XA

## 2018-11-10 LAB — CBC
HCT: 45.8 % (ref 39.0–52.0)
Hemoglobin: 14.9 g/dL (ref 13.0–17.0)
MCH: 30.2 pg (ref 26.0–34.0)
MCHC: 32.5 g/dL (ref 30.0–36.0)
MCV: 92.7 fL (ref 80.0–100.0)
Platelets: 255 10*3/uL (ref 150–400)
RBC: 4.94 MIL/uL (ref 4.22–5.81)
RDW: 12.6 % (ref 11.5–15.5)
WBC: 6.5 10*3/uL (ref 4.0–10.5)
nRBC: 0 % (ref 0.0–0.2)

## 2018-11-10 LAB — BASIC METABOLIC PANEL
Anion gap: 10 (ref 5–15)
BUN: 12 mg/dL (ref 8–23)
CO2: 23 mmol/L (ref 22–32)
Calcium: 9 mg/dL (ref 8.9–10.3)
Chloride: 106 mmol/L (ref 98–111)
Creatinine, Ser: 0.97 mg/dL (ref 0.61–1.24)
GFR calc Af Amer: >60 mL/min (ref >60)
GFR calc non Af Amer: >60 mL/min (ref >60)
Glucose, Bld: 99 mg/dL (ref 70–99)
Potassium: 3.7 mmol/L (ref 3.5–5.1)
Sodium: 139 mmol/L (ref 135–145)

## 2018-11-10 NOTE — Progress Notes (Signed)
PCP - Dr Maudie Mercury.[place] Cardiologist - [facility]  Chest x-ray - 5/19 EKG -5/20  Stress Test - NA     ECHO - 2.19 Cardiac Cath -2/19        Blood Thinner Instructions:STOP PLAVIX   10/15/18 Aspirin Instructions:SEE MD NOTE  Anesthesia review: HX CAD Patient denies shortness of breath, fever, cough and chest pain at PAT appointment   Patient verbalized understanding of instructions that were given to them at the PAT appointment. Patient was also instructed that they will need to review over the PAT instructions again at home before surgery.

## 2018-11-11 NOTE — Anesthesia Preprocedure Evaluation (Addendum)
Anesthesia Evaluation  Patient identified by MRN, date of birth, ID band Patient awake    Reviewed: Allergy & Precautions, H&P , NPO status , Patient's Chart, lab work & pertinent test results, reviewed documented beta blocker date and time   History of Anesthesia Complications Negative for: history of anesthetic complications  Airway Mallampati: II  TM Distance: >3 FB Neck ROM: Full    Dental no notable dental hx.    Pulmonary neg pulmonary ROS,    Pulmonary exam normal        Cardiovascular hypertension, Pt. on medications and Pt. on home beta blockers + CAD and + Cardiac Stents (04/2017)  Normal cardiovascular exam  TTE 04/2017: EF 45-50%, possible hypokinesis of the apical, anteroseptal, anterior, and apical myocardium    Neuro/Psych Anxiety negative neurological ROS     GI/Hepatic negative GI ROS, Neg liver ROS,   Endo/Other  negative endocrine ROS  Renal/GU negative Renal ROS  negative genitourinary   Musculoskeletal   Abdominal   Peds  Hematology negative hematology ROS (+)   Anesthesia Other Findings   Reproductive/Obstetrics negative OB ROS                           Anesthesia Physical Anesthesia Plan  ASA: III  Anesthesia Plan: General   Post-op Pain Management: GA combined w/ Regional for post-op pain   Induction: Intravenous  PONV Risk Score and Plan: 3 and Dexamethasone, Midazolam and Ondansetron  Airway Management Planned: LMA  Additional Equipment: None  Intra-op Plan:   Post-operative Plan: Extubation in OR  Informed Consent: I have reviewed the patients History and Physical, chart, labs and discussed the procedure including the risks, benefits and alternatives for the proposed anesthesia with the patient or authorized representative who has indicated his/her understanding and acceptance.     Dental advisory given  Plan Discussed with: CRNA  Anesthesia  Plan Comments: (Follows with cardiology for CAD s/p PCI, March 2019, Ascending aortic aneurysm (4.2 cm by scan 5/20, annual monitoring recommended).  Cleared by Dr. Acie Fredrickson 11/01/18: "He is now on Plavix and is not on ASA. He may hold Plavix for 5 days prior to surgery and restart as soon as possible following the procedure. I would prefer that he take ASA 81 mg a day while off the Plavix if OK with Ortho."  Some discrepancy in PAT documentation regarding when pt stopped Plavix. I called pt and he stated he stopped it on 8/22 in anticipation of surgery. He also says he is not taking 81mg  ASA per Dr. Stann Mainland' recommendation.   TTE 05/08/17: Study Conclusions  - Left ventricle: The cavity size was normal. Systolic function was   mildly reduced. The estimated ejection fraction was in the range   of 45% to 50%. Possible hypokinesis of the apicalanteroseptal,   anterior, and apical myocardium. Left ventricular diastolic   function parameters were normal.  Cath/PCI 05/08/17: 1. Severe serial stenoses in the mid LAD. Successful PTCA/DES x 1 mid LAD 2. Mild non-obstructive disease in the Circumflex, RCA and proximal LAD.  3. Normal LV systolic function 4. Thoracic aortic aneurysm.   Recommendation: Continue DAPT with ASA and Plavix for one year. Continue beta blocker and statin. In regards to his aortic aneurysm, the outside CT scan report notes a 4.4 cm aneurysm. I would recommend f/u with CTA in 6 months. )     Anesthesia Quick Evaluation

## 2018-11-12 ENCOUNTER — Other Ambulatory Visit (HOSPITAL_COMMUNITY)
Admission: RE | Admit: 2018-11-12 | Discharge: 2018-11-12 | Disposition: A | Discharge status: Home / Self Care | Payer: BC Managed Care – PPO | Source: Ambulatory Visit | Attending: Orthopedic Surgery | Admitting: Orthopedic Surgery

## 2018-11-12 DIAGNOSIS — Z20828 Contact with and (suspected) exposure to other viral communicable diseases: Secondary | ICD-10-CM | POA: Insufficient documentation

## 2018-11-12 DIAGNOSIS — Z01812 Encounter for preprocedural laboratory examination: Secondary | ICD-10-CM | POA: Diagnosis present

## 2018-11-12 LAB — SARS CORONAVIRUS 2 (TAT 6-24 HRS): SARS Coronavirus 2: NEGATIVE

## 2018-11-16 ENCOUNTER — Ambulatory Visit (HOSPITAL_COMMUNITY)
Admission: RE | Admit: 2018-11-16 | Discharge: 2018-11-16 | Disposition: A | Discharge status: Home / Self Care | Payer: BC Managed Care – PPO | Attending: Orthopedic Surgery | Admitting: Orthopedic Surgery

## 2018-11-16 ENCOUNTER — Encounter (HOSPITAL_COMMUNITY): Payer: Self-pay

## 2018-11-16 ENCOUNTER — Other Ambulatory Visit: Payer: Self-pay

## 2018-11-16 ENCOUNTER — Ambulatory Visit (HOSPITAL_COMMUNITY): Payer: BC Managed Care – PPO

## 2018-11-16 ENCOUNTER — Encounter (HOSPITAL_COMMUNITY)
Admission: RE | Disposition: A | Discharge status: Home / Self Care | Payer: Self-pay | Source: Home / Self Care | Attending: Orthopedic Surgery

## 2018-11-16 ENCOUNTER — Ambulatory Visit (HOSPITAL_COMMUNITY): Payer: BC Managed Care – PPO | Admitting: Certified Registered"

## 2018-11-16 ENCOUNTER — Ambulatory Visit (HOSPITAL_COMMUNITY): Payer: BC Managed Care – PPO | Admitting: Physician Assistant

## 2018-11-16 DIAGNOSIS — Z8249 Family history of ischemic heart disease and other diseases of the circulatory system: Secondary | ICD-10-CM | POA: Diagnosis not present

## 2018-11-16 DIAGNOSIS — M84351A Stress fracture, right femur, initial encounter for fracture: Secondary | ICD-10-CM | POA: Insufficient documentation

## 2018-11-16 DIAGNOSIS — Z880 Allergy status to penicillin: Secondary | ICD-10-CM | POA: Insufficient documentation

## 2018-11-16 DIAGNOSIS — I10 Essential (primary) hypertension: Secondary | ICD-10-CM | POA: Diagnosis not present

## 2018-11-16 DIAGNOSIS — X58XXXA Exposure to other specified factors, initial encounter: Secondary | ICD-10-CM | POA: Insufficient documentation

## 2018-11-16 DIAGNOSIS — S83231A Complex tear of medial meniscus, current injury, right knee, initial encounter: Secondary | ICD-10-CM | POA: Insufficient documentation

## 2018-11-16 DIAGNOSIS — I2510 Atherosclerotic heart disease of native coronary artery without angina pectoris: Secondary | ICD-10-CM | POA: Diagnosis not present

## 2018-11-16 DIAGNOSIS — M94261 Chondromalacia, right knee: Secondary | ICD-10-CM | POA: Insufficient documentation

## 2018-11-16 DIAGNOSIS — Z79899 Other long term (current) drug therapy: Secondary | ICD-10-CM | POA: Diagnosis not present

## 2018-11-16 DIAGNOSIS — F419 Anxiety disorder, unspecified: Secondary | ICD-10-CM | POA: Insufficient documentation

## 2018-11-16 DIAGNOSIS — M6751 Plica syndrome, right knee: Secondary | ICD-10-CM | POA: Diagnosis not present

## 2018-11-16 DIAGNOSIS — Z7982 Long term (current) use of aspirin: Secondary | ICD-10-CM | POA: Diagnosis not present

## 2018-11-16 DIAGNOSIS — S83241A Other tear of medial meniscus, current injury, right knee, initial encounter: Secondary | ICD-10-CM | POA: Diagnosis present

## 2018-11-16 DIAGNOSIS — R911 Solitary pulmonary nodule: Secondary | ICD-10-CM | POA: Insufficient documentation

## 2018-11-16 DIAGNOSIS — Z955 Presence of coronary angioplasty implant and graft: Secondary | ICD-10-CM | POA: Insufficient documentation

## 2018-11-16 DIAGNOSIS — I712 Thoracic aortic aneurysm, without rupture: Secondary | ICD-10-CM | POA: Insufficient documentation

## 2018-11-16 DIAGNOSIS — Z419 Encounter for procedure for purposes other than remedying health state, unspecified: Secondary | ICD-10-CM

## 2018-11-16 HISTORY — PX: KNEE ARTHROSCOPY WITH SUBCHONDROPLASTY: SHX6732

## 2018-11-16 SURGERY — ARTHROSCOPY, KNEE, WITH SUBCHONDROPLASTY
Anesthesia: General | Site: Knee | Laterality: Right

## 2018-11-16 MED ORDER — PHENYLEPHRINE HCL (PRESSORS) 10 MG/ML IV SOLN
INTRAVENOUS | Status: DC | PRN
  Administered 2018-11-16 (×4): 80 ug via INTRAVENOUS

## 2018-11-16 MED ORDER — PROPOFOL 10 MG/ML IV BOLUS
INTRAVENOUS | Status: DC | PRN
  Administered 2018-11-16: 170 mg via INTRAVENOUS
  Administered 2018-11-16: 30 mg via INTRAVENOUS

## 2018-11-16 MED ORDER — OXYCODONE HCL 5 MG PO TABS: 5.0000 mg | ORAL_TABLET | Freq: Three times a day (TID) | ORAL | 0 refills | Status: AC | PRN

## 2018-11-16 MED ORDER — PROPOFOL 10 MG/ML IV BOLUS
INTRAVENOUS | Status: AC
  Filled 2018-11-16: qty 20

## 2018-11-16 MED ORDER — MIDAZOLAM HCL 2 MG/2ML IJ SOLN
1.0000 mg | Freq: Once | INTRAMUSCULAR | Status: AC
  Administered 2018-11-16: 12:00:00 1 mg via INTRAVENOUS

## 2018-11-16 MED ORDER — ACETAMINOPHEN 500 MG PO TABS
1000.0000 mg | ORAL_TABLET | Freq: Once | ORAL | Status: AC
  Administered 2018-11-16: 1000 mg via ORAL
  Filled 2018-11-16: qty 2

## 2018-11-16 MED ORDER — BUPIVACAINE-EPINEPHRINE (PF) 0.5% -1:200000 IJ SOLN
INTRAMUSCULAR | Status: DC | PRN
  Administered 2018-11-16: 15 mL via PERINEURAL

## 2018-11-16 MED ORDER — CHLORHEXIDINE GLUCONATE 4 % EX LIQD: 60.0000 mL | Freq: Once | CUTANEOUS | Status: DC

## 2018-11-16 MED ORDER — FENTANYL CITRATE (PF) 100 MCG/2ML IJ SOLN
INTRAMUSCULAR | Status: AC
  Administered 2018-11-16: 50 ug via INTRAVENOUS
  Filled 2018-11-16: qty 2

## 2018-11-16 MED ORDER — FENTANYL CITRATE (PF) 100 MCG/2ML IJ SOLN
50.0000 ug | Freq: Once | INTRAMUSCULAR | Status: AC
  Administered 2018-11-16: 12:00:00 50 ug via INTRAVENOUS

## 2018-11-16 MED ORDER — FENTANYL CITRATE (PF) 250 MCG/5ML IJ SOLN
INTRAMUSCULAR | Status: AC
  Filled 2018-11-16: qty 5

## 2018-11-16 MED ORDER — FENTANYL CITRATE (PF) 250 MCG/5ML IJ SOLN
INTRAMUSCULAR | Status: DC | PRN
  Administered 2018-11-16: 25 ug via INTRAVENOUS
  Administered 2018-11-16: 50 ug via INTRAVENOUS

## 2018-11-16 MED ORDER — DEXAMETHASONE SODIUM PHOSPHATE 4 MG/ML IJ SOLN
INTRAMUSCULAR | Status: DC | PRN
  Administered 2018-11-16: 10 mg via INTRAVENOUS

## 2018-11-16 MED ORDER — LACTATED RINGERS IV SOLN
INTRAVENOUS | Status: DC
  Administered 2018-11-16: 11:00:00 via INTRAVENOUS

## 2018-11-16 MED ORDER — MIDAZOLAM HCL 2 MG/2ML IJ SOLN
INTRAMUSCULAR | Status: AC
  Filled 2018-11-16: qty 2

## 2018-11-16 MED ORDER — CEFAZOLIN SODIUM-DEXTROSE 2-4 GM/100ML-% IV SOLN
2.0000 g | INTRAVENOUS | Status: AC
  Administered 2018-11-16: 2 g via INTRAVENOUS

## 2018-11-16 MED ORDER — MIDAZOLAM HCL 2 MG/2ML IJ SOLN
INTRAMUSCULAR | Status: AC
  Administered 2018-11-16: 1 mg via INTRAVENOUS
  Filled 2018-11-16: qty 2

## 2018-11-16 MED ORDER — LACTATED RINGERS IV SOLN
INTRAVENOUS | Status: DC | PRN
  Administered 2018-11-16 (×2): via INTRAVENOUS

## 2018-11-16 MED ORDER — SODIUM CHLORIDE 0.9 % IR SOLN
Status: DC | PRN
  Administered 2018-11-16: 7000 mL

## 2018-11-16 MED ORDER — HYDROMORPHONE HCL 1 MG/ML IJ SOLN: 0.2500 mg | INTRAMUSCULAR | Status: DC | PRN

## 2018-11-16 MED ORDER — ONDANSETRON HCL 4 MG/2ML IJ SOLN
INTRAMUSCULAR | Status: DC | PRN
  Administered 2018-11-16: 4 mg via INTRAVENOUS

## 2018-11-16 MED ORDER — LIDOCAINE HCL (CARDIAC) PF 100 MG/5ML IV SOSY
PREFILLED_SYRINGE | INTRAVENOUS | Status: DC | PRN
  Administered 2018-11-16: 40 mg via INTRAVENOUS
  Administered 2018-11-16: 60 mg via INTRAVENOUS

## 2018-11-16 MED ORDER — ONDANSETRON 4 MG PO TBDP: 4.0000 mg | ORAL_TABLET | Freq: Three times a day (TID) | ORAL | 0 refills | Status: DC | PRN

## 2018-11-16 SURGICAL SUPPLY — 33 items
ACCMIX MIXING SYSTEM (SYSTAGENIX WOUND MANAGEMENT) ×2 IMPLANT
ALCOHOL 70% 16 OZ (MISCELLANEOUS) ×2 IMPLANT
BLADE CUTTER GATOR 3.5 (BLADE) ×2 IMPLANT
BLADE SHAVER TORPEDO 4X13 (MISCELLANEOUS) ×2 IMPLANT
BLADE SURG 10 STRL SS (BLADE) IMPLANT
BNDG ELASTIC 6X5.8 VLCR STR LF (GAUZE/BANDAGES/DRESSINGS) ×2 IMPLANT
COVER SURGICAL LIGHT HANDLE (MISCELLANEOUS) ×2 IMPLANT
COVER WAND RF STERILE (DRAPES) ×2 IMPLANT
DRAPE U-SHAPE 47X51 STRL (DRAPES) ×2 IMPLANT
DRSG PAD ABDOMINAL 8X10 ST (GAUZE/BANDAGES/DRESSINGS) ×2 IMPLANT
DRSG XEROFORM 1X8 (GAUZE/BANDAGES/DRESSINGS) ×2 IMPLANT
DURAPREP 26ML APPLICATOR (WOUND CARE) ×2 IMPLANT
GAUZE 4X4 16PLY RFD (DISPOSABLE) ×2 IMPLANT
GAUZE SPONGE 4X4 12PLY STRL LF (GAUZE/BANDAGES/DRESSINGS) ×2 IMPLANT
GAUZE XEROFORM 1X8 LF (GAUZE/BANDAGES/DRESSINGS) ×2 IMPLANT
GLOVE BIO SURGEON STRL SZ7.5 (GLOVE) ×2 IMPLANT
GLOVE BIOGEL PI IND STRL 8 (GLOVE) ×1 IMPLANT
GLOVE BIOGEL PI INDICATOR 8 (GLOVE) ×1
GOWN STRL REUS W/ TWL LRG LVL3 (GOWN DISPOSABLE) ×2 IMPLANT
GOWN STRL REUS W/ TWL XL LVL3 (GOWN DISPOSABLE) IMPLANT
GOWN STRL REUS W/TWL LRG LVL3 (GOWN DISPOSABLE) ×2
GOWN STRL REUS W/TWL XL LVL3 (GOWN DISPOSABLE)
KIT BASIN OR (CUSTOM PROCEDURE TRAY) ×2 IMPLANT
KNEE KIT SCP W/SIDE ACCUPORT (Joint) ×2 IMPLANT
MANIFOLD NEPTUNE II (INSTRUMENTS) ×2 IMPLANT
PACK ARTHROSCOPY DSU (CUSTOM PROCEDURE TRAY) ×2 IMPLANT
PAD ABD 8X10 STRL (GAUZE/BANDAGES/DRESSINGS) ×2 IMPLANT
PADDING CAST COTTON 6X4 STRL (CAST SUPPLIES) ×2 IMPLANT
SUT ETHILON 4 0 PS 2 18 (SUTURE) ×2 IMPLANT
SYR 30ML LL (SYRINGE) ×2 IMPLANT
TOWEL GREEN STERILE (TOWEL DISPOSABLE) ×2 IMPLANT
TUBING ARTHROSCOPY IRRIG 16FT (MISCELLANEOUS) ×2 IMPLANT
WRAP KNEE MAXI GEL POST OP (GAUZE/BANDAGES/DRESSINGS) ×2 IMPLANT

## 2018-11-16 NOTE — Anesthesia Procedure Notes (Signed)
Procedure Name: LMA Insertion Date/Time: 11/16/2018 12:27 PM Performed by: Gaylene Brooks, CRNA Pre-anesthesia Checklist: Patient identified, Emergency Drugs available, Suction available and Patient being monitored Patient Re-evaluated:Patient Re-evaluated prior to induction Oxygen Delivery Method: Circle System Utilized Preoxygenation: Pre-oxygenation with 100% oxygen Induction Type: IV induction Ventilation: Mask ventilation without difficulty LMA: LMA inserted LMA Size: 5.0 Number of attempts: 1 Airway Equipment and Method: Bite block Placement Confirmation: positive ETCO2 Tube secured with: Tape Dental Injury: Teeth and Oropharynx as per pre-operative assessment
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
## 2018-11-16 NOTE — Anesthesia Procedure Notes (Addendum)
Anesthesia Regional Block: Adductor canal block   Pre-Anesthetic Checklist: ,, timeout performed, Correct Patient, Correct Site, Correct Laterality, Correct Procedure, Correct Position, site marked, Risks and benefits discussed, pre-op evaluation,  At surgeon's request and post-op pain management  Laterality: Right  Prep: Maximum Sterile Barrier Precautions used, chloraprep       Needles:  Injection technique: Single-shot  Needle Type: Echogenic Stimulator Needle     Needle Length: 9cm  Needle Gauge: 22     Additional Needles:   Procedures:,,,, ultrasound used (permanent image in chart),,,,  Narrative:  Start time: 11/16/2018 11:37 AM End time: 11/16/2018 11:39 AM Injection made incrementally with aspirations every 5 mL.  Performed by: Personally  Anesthesiologist: Brennan Bailey, MD  Additional Notes: Risks, benefits, and alternative discussed. Patient gave consent for procedure. Patient prepped and draped in sterile fashion. Sedation administered, patient remains easily responsive to voice. Relevant anatomy identified with ultrasound guidance. Local anesthetic given in 5cc increments with no signs or symptoms of intravascular injection. No pain or paraesthesias with injection. Patient monitored throughout procedure with signs of LAST or immediate complications. Tolerated well. Ultrasound image placed in chart.  Tawny Asal, MD

## 2018-11-16 NOTE — Discharge Instructions (Signed)
-  Maintain postoperative bandages for 3 days.  You may remove these on the third day and begin showering.  You should then replaced with clean dry dressings once per day.  Do not submerge underwater.  -Apply ice to the right knee for 30 minutes at a time.  He should do this throughout the day every hour that you are awake.  -You are appropriate for full weightbearing as tolerated to the right lower extremity.  You may need crutches for assistance but are safe to put full weight.  -You may resume your anticoagulant beginning tomorrow.  -For mild to moderate pain use Tylenol as needed.  This in addition to ice should be appropriate.  For breakthrough pain use oxycodone as needed.

## 2018-11-16 NOTE — Brief Op Note (Signed)
11/16/2018  1:27 PM  PATIENT:  Rodney Santos  63 y.o. male  PRE-OPERATIVE DIAGNOSIS:  Right knee medial meniscus tear, medial femur stress fracture  POST-OPERATIVE DIAGNOSIS:  right knee medial meniscus tear, medial femoral stress fracture  PROCEDURE:  Procedure(s) with comments: Right knee arthroscopic partial medial menisectomy with medial femoral condyle subchondroplasty (Right) - 60 mins  SURGEON:  Surgeon(s) and Role:    * Nicholes Stairs, MD - Primary  PHYSICIAN ASSISTANT:   ASSISTANTS: none   ANESTHESIA:   general  EBL:  10 cc  BLOOD ADMINISTERED:none  DRAINS: none   LOCAL MEDICATIONS USED:  NONE  SPECIMEN:  No Specimen  DISPOSITION OF SPECIMEN:  N/A  COUNTS:  YES  TOURNIQUET:  * No tourniquets in log *  DICTATION: .Note written in EPIC  PLAN OF CARE: Discharge to home after PACU  PATIENT DISPOSITION:  PACU - hemodynamically stable.   Delay start of Pharmacological VTE agent (>24hrs) due to surgical blood loss or risk of bleeding: not applicable

## 2018-11-16 NOTE — Transfer of Care (Addendum)
Immediate Anesthesia Transfer of Care Note  Patient: Rodney Santos  Procedure(s) Performed: Right knee arthroscopic partial medial menisectomy with medial femoral condyle subchondroplasty (Right Knee)  Patient Location: PACU  Anesthesia Type:GA combined with regional for post-op pain  Level of Consciousness: awake, alert  and patient cooperative  Airway & Oxygen Therapy: Patient Spontanous Breathing  Post-op Assessment: Report given to RN and Post -op Vital signs reviewed and stable  Post vital signs: Reviewed and stable  Last Vitals:  Vitals Value Taken Time  BP 145/93 11/16/18 1333  Temp    Pulse 57 11/16/18 1334  Resp 14 11/16/18 1334  SpO2 99 % 11/16/18 1334  Vitals shown include unvalidated device data.  Last Pain:  Vitals:   11/16/18 1057  TempSrc:   PainSc: 0-No pain         Complications: No apparent anesthesia complications

## 2018-11-16 NOTE — Anesthesia Postprocedure Evaluation (Signed)
Anesthesia Post Note  Patient: Rodney Santos  Procedure(s) Performed: Right knee arthroscopic partial medial menisectomy with medial femoral condyle subchondroplasty (Right Knee)     Patient location during evaluation: PACU Anesthesia Type: General Level of consciousness: awake and alert and oriented Pain management: pain level controlled Vital Signs Assessment: post-procedure vital signs reviewed and stable Respiratory status: spontaneous breathing, nonlabored ventilation and respiratory function stable Cardiovascular status: blood pressure returned to baseline Postop Assessment: no apparent nausea or vomiting Anesthetic complications: no    Last Vitals:  Vitals:   11/16/18 1355 11/16/18 1400  BP:  (!) 122/99  Pulse: (!) 52 (!) 58  Resp: 13 16  Temp:  36.5 C  SpO2: 95% 95%    Last Pain:  Vitals:   11/16/18 1400  TempSrc:   PainSc: [place]

## 2018-11-16 NOTE — Op Note (Signed)
Surgeon(s): Nicholes Stairs, MD   ANESTHESIA:  general, and regional   FLUIDS: Per anesthesia record.    ESTIMATED BLOOD LOSS: minimal     PREOPERATIVE DIAGNOSES:  1. right knee medial meniscus tear 2.  right medial femoral condyle insufficiency fracture 3.   right medial femoral condyle chondromalacia   POSTOPERATIVE DIAGNOSES:  1. right knee medial meniscus tear 2.  right medial femoral condyle insufficiency fracture 3.   right medial femoral condyle chondromalacia 4.  Right knee medial shelf plica   PROCEDURES PERFORMED:  1. Right knee arthroscopically aided treatment of medial femoral condyle insufficiency fracture with percutaneous internal fixation (subchondroplasty) (05397) 2. Right knee arthroscopy with arthroscopic partial medial meniscectomy 3.  Chondroplasty, right medial femoral condyle 4.  Right knee knee arthroscopic limited synovectomy for medial plica   Implant: Flowable calcium phosphate, 5 mL. Zimmer   DESCRIPTION OF PROCEDURE: The patient has a right knee medial meniscus tear. They have had pain that has been refractory to conservative management. Their preoperative MRI demonstrated subchondral bone marrow edema and insufficiency fractures of the  right medial femoral condyle as well as the medial meniscus tear. Plans are to proceed with partial medial meniscectomy, internal fixation of subchondral insufficiency fractures with flowable calcium phosphate, and diagnostic arthroscopy with debridement as indicated. Full discussion held regarding risks benefits alternatives and complications related surgical intervention. Conservative care options reviewed. All questions answered.   The patient was identified in the preoperative holding area and the operative extremity was marked. The patient was brought to the operating room and transferred to operating table in a supine position. Satisfactory general anesthesia was induced by anesthesiology.     Standard  anterolateral, anteromedial arthroscopy portals were obtained. The anteromedial portal was obtained with a spinal needle for localization under direct visualization with subsequent diagnostic findings.    Anteromedial and anterolateral chambers: moderate synovitis. The synovitis was debrided with a 4.5 mm full radius shaver through both the anteromedial and lateral portals.   We began with the limited synovectomy.  Utilizing the full-radius shaver the medial shelf plica was resected back to the level of normal synovium.  Of note there was an area of grade III and IV chondromalacia on the medial trochlear ridge from friction of the medial plica.   Suprapatellar pouch and gutters: mild synovitis or debris. Patella chondral surface: Grade 1 Trochlear chondral surface: Grade 3-4 Patellofemoral tracking: level Medial meniscus: posterior horn and mid body complex degenerative tearing.  Medial femoral condyle flexion bearing surface: Grade 4 over a the flexion surface in an area of approximately 2.5 cm Medial femoral condyle extension bearing surface: Grade 2 Medial tibial plateau: Grade 1 Anterior cruciate ligament:stable Posterior cruciate ligament:stable Lateral meniscus: no tear.   Lateral femoral condyle flexion bearing surface: Grade 0 Lateral femoral condyle extension bearing surface: Grade 0 Lateral tibial plateau: Grade 0   Medial meniscus tear was debrided using biters and motorized shaver alternating until a stable remnant was left. Upon completion the probe was used to evaluate and assess the remaining meniscus which was gleaned to be stable.   Chondroplasty was achieved on the medial femoral condyle using a motorized shaver to debride the grade 3-4 unstable cartilage. Completion of the chondroplasty left A medial femoral condyle with smooth stable surface. There was no full-thickness component noted.   Next we turned our attention to the internal fixation of the  medial femoral  condyle. Arthroscopically we evaluated the condyle noted there was no loose cartilage, following our chondroplasty,  or debris surrounding the lesion and the fracture did not propagate to the joint surface. Using preoperative MRI we targeted the delivery device to just under the subchondral density and in the central portion of the medial femoral condyle. This was achieved with intraoperative fluoroscopy. Once accurate placement was noted on 2 views and confirmed we delivered 3 mL of flowable calcium phosphate into the subchondral bone marrow lesion. We left the cannulas in place for approximately 8 minutes while the implant hardened. We removed the cannulas and again took 2 views of fluoroscopic pictures to confirm there was no extravasation outside of the bone. There was none noted.   After completion of synovectomy, diagnostic exam, and debridements as described, all compartments were checked and no residual debris remained. Hemostasis was achieved with the cautery wand. The portals were approximated with nylon suture. All excess fluid was expressed from the joint.  Xeroform sterile gauze dressings were applied followed by Ace bandage and ice pack.    There were no immediate competitions and all counts were correct.   DISPOSITION: The patient was awakened from general anesthetic, extubated, taken to the recovery room in medically stable condition, no apparent complications. The patient may be weightbearing as tolerated to the operative lower extremity with crutches.  Range of motion of right knee as tolerated.  They will use bid asa for DVT ppx, and return in 2 weeks for suture removal.   Yolonda KidaJason Patrick Milanya [place]

## 2018-11-17 ENCOUNTER — Encounter (HOSPITAL_COMMUNITY): Payer: Self-pay | Admitting: Orthopedic Surgery

## 2018-11-17 NOTE — Addendum Note (Signed)
Addendum  created 11/17/18 1937 by Brennan Bailey, MD   Clinical Note Signed, Intraprocedure Blocks edited

## 2019-04-30 ENCOUNTER — Other Ambulatory Visit: Payer: Self-pay | Admitting: Cardiovascular Disease

## 2019-05-11 ENCOUNTER — Other Ambulatory Visit: Payer: BC Managed Care – PPO | Admitting: *Deleted

## 2019-05-11 ENCOUNTER — Other Ambulatory Visit: Payer: Self-pay

## 2019-05-11 DIAGNOSIS — I712 Thoracic aortic aneurysm, without rupture, unspecified: Secondary | ICD-10-CM

## 2019-05-11 DIAGNOSIS — I2583 Coronary atherosclerosis due to lipid rich plaque: Secondary | ICD-10-CM

## 2019-05-11 DIAGNOSIS — I2510 Atherosclerotic heart disease of native coronary artery without angina pectoris: Secondary | ICD-10-CM

## 2019-05-11 LAB — LIPID PANEL
Chol/HDL Ratio: 3.5 ratio (ref 0.0–5.0)
Cholesterol, Total: 121 mg/dL (ref 100–199)
HDL: 35 mg/dL — ABNORMAL LOW (ref >39)
LDL Chol Calc (NIH): 66 mg/dL (ref 0–99)
Triglycerides: 108 mg/dL (ref 0–149)
VLDL Cholesterol Cal: 20 mg/dL (ref 5–40)

## 2019-05-11 LAB — BASIC METABOLIC PANEL
BUN/Creatinine Ratio: 12 (ref 10–24)
BUN: 12 mg/dL (ref 8–27)
CO2: 22 mmol/L (ref 20–29)
Calcium: 9 mg/dL (ref 8.6–10.2)
Chloride: 102 mmol/L (ref 96–106)
Creatinine, Ser: 1.04 mg/dL (ref 0.76–1.27)
GFR calc Af Amer: 89 mL/min/{1.73_m2} (ref >59)
GFR calc non Af Amer: 77 mL/min/{1.73_m2} (ref >59)
Glucose: 96 mg/dL (ref 65–99)
Potassium: 4.2 mmol/L (ref 3.5–5.2)
Sodium: 139 mmol/L (ref 134–144)

## 2019-05-11 LAB — HEPATIC FUNCTION PANEL
ALT: 29 IU/L (ref 0–44)
AST: 29 IU/L (ref 0–40)
Albumin: 4.6 g/dL (ref 3.8–4.8)
Alkaline Phosphatase: 83 IU/L (ref 39–117)
Bilirubin Total: 1.5 mg/dL — ABNORMAL HIGH (ref 0.0–1.2)
Bilirubin, Direct: 0.3 mg/dL (ref 0.00–0.40)
Total Protein: 6.9 g/dL (ref 6.0–8.5)

## 2019-05-13 ENCOUNTER — Encounter: Payer: Self-pay | Admitting: *Deleted

## 2019-05-13 ENCOUNTER — Ambulatory Visit (INDEPENDENT_AMBULATORY_CARE_PROVIDER_SITE_OTHER): Payer: BC Managed Care – PPO | Admitting: Cardiovascular Disease

## 2019-05-13 ENCOUNTER — Other Ambulatory Visit: Payer: Self-pay

## 2019-05-13 ENCOUNTER — Encounter: Payer: Self-pay | Admitting: Cardiovascular Disease

## 2019-05-13 VITALS — BP 112/78 | HR 66 | Ht 70.0 in | Wt 216.8 lb

## 2019-05-13 DIAGNOSIS — I2583 Coronary atherosclerosis due to lipid rich plaque: Secondary | ICD-10-CM

## 2019-05-13 DIAGNOSIS — Z79899 Other long term (current) drug therapy: Secondary | ICD-10-CM

## 2019-05-13 DIAGNOSIS — I2510 Atherosclerotic heart disease of native coronary artery without angina pectoris: Secondary | ICD-10-CM

## 2019-05-13 DIAGNOSIS — I712 Thoracic aortic aneurysm, without rupture, unspecified: Secondary | ICD-10-CM

## 2019-05-13 DIAGNOSIS — E785 Hyperlipidemia, unspecified: Secondary | ICD-10-CM

## 2019-05-13 NOTE — Patient Instructions (Signed)
Medication Instructions:  The current medical regimen is effective;  continue present plan and medications.  *If you need a refill on your cardiac medications before your next appointment, please call your pharmacy*  Lab Work: Please have blood work in 1 year before seeing Dr Elease Hashimoto. (BMP, Lipid,hepatic)  If you have labs (blood work) drawn today and your tests are completely normal, you will receive your results only by: Marland Kitchen MyChart Message (if you have MyChart) OR . A paper copy in the mail If you have any lab test that is abnormal or we need to change your treatment, we will call you to review the results.   Testing/Procedures: Your physician has requested that you have an exercise tolerance test. For further information please visit [email or web address] Please also follow instruction sheet, as given.  You will need a Covid screening test before this test can be completed.  Non-Cardiac CT Angiography (CTA)82f the chest (May 2021), is a special type of CT scan that uses a computer to produce multi-dimensional views of major blood vessels throughout the body. In CT angiography, a contrast material is injected through an IV to help visualize the blood vessels  Follow-Up: At [facility], you and your health needs are our priority.  As part of our continuing mission to provide you with exceptional heart care, we have created designated Provider Care Teams.  These Care Teams include your primary Cardiologist (physician) and Advanced Practice Providers (APPs -  Physician Assistants and Nurse Practitioners) who all work together to provide you with the care you need, when you need it.  We recommend signing up for the patient portal called "MyChart".  Sign up information is provided on this After Visit Summary.  MyChart is used to connect with patients for Virtual Visits (Telemedicine).  Patients are able to view lab/test results, encounter notes, upcoming appointments, etc.  Non-urgent messages can  be sent to your provider as well.   To learn more about what you can do with MyChart, go to [email or web address].    Your next appointment:   12 month(s)  The format for your next appointment:   In Person  Provider:   Kristeen Miss, MD   Thank you for choosing [facility]!!
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
## 2019-06-10 ENCOUNTER — Other Ambulatory Visit (HOSPITAL_COMMUNITY)
Admission: RE | Admit: 2019-06-10 | Discharge: 2019-06-10 | Disposition: A | Discharge status: Home / Self Care | Payer: BC Managed Care – PPO | Source: Ambulatory Visit | Attending: Cardiovascular Disease | Admitting: Cardiovascular Disease

## 2019-06-10 DIAGNOSIS — Z20822 Contact with and (suspected) exposure to covid-19: Secondary | ICD-10-CM | POA: Insufficient documentation

## 2019-06-10 DIAGNOSIS — Z01812 Encounter for preprocedural laboratory examination: Secondary | ICD-10-CM | POA: Diagnosis present

## 2019-06-10 LAB — SARS CORONAVIRUS 2 (TAT 6-24 HRS): SARS Coronavirus 2: NEGATIVE

## 2019-06-14 ENCOUNTER — Other Ambulatory Visit: Payer: Self-pay

## 2019-06-14 ENCOUNTER — Ambulatory Visit (INDEPENDENT_AMBULATORY_CARE_PROVIDER_SITE_OTHER): Payer: BC Managed Care – PPO

## 2019-06-14 DIAGNOSIS — I2583 Coronary atherosclerosis due to lipid rich plaque: Secondary | ICD-10-CM

## 2019-06-14 DIAGNOSIS — I2510 Atherosclerotic heart disease of native coronary artery without angina pectoris: Secondary | ICD-10-CM | POA: Diagnosis not present

## 2019-06-14 LAB — EXERCISE TOLERANCE TEST
Estimated workload: 7 METS
Exercise duration (min): 6 min
Exercise duration (sec): 31 s
MPHR: 159 {beats}/min
Peak HR: 139 {beats}/min
Percent HR: 87 %
RPE: 15
Rest HR: 62 {beats}/min

## 2019-07-24 ENCOUNTER — Other Ambulatory Visit: Payer: Self-pay | Admitting: Cardiovascular Disease

## 2019-08-12 ENCOUNTER — Other Ambulatory Visit: Payer: BC Managed Care – PPO | Admitting: *Deleted

## 2019-08-12 ENCOUNTER — Other Ambulatory Visit: Payer: Self-pay

## 2019-08-12 DIAGNOSIS — E785 Hyperlipidemia, unspecified: Secondary | ICD-10-CM

## 2019-08-12 DIAGNOSIS — Z79899 Other long term (current) drug therapy: Secondary | ICD-10-CM

## 2019-08-12 LAB — LIPID PANEL
Chol/HDL Ratio: 4.1 ratio (ref 0.0–5.0)
Cholesterol, Total: 131 mg/dL (ref 100–199)
HDL: 32 mg/dL — ABNORMAL LOW (ref >39)
LDL Chol Calc (NIH): 67 mg/dL (ref 0–99)
Triglycerides: 188 mg/dL — ABNORMAL HIGH (ref 0–149)
VLDL Cholesterol Cal: 32 mg/dL (ref 5–40)

## 2019-08-12 LAB — BASIC METABOLIC PANEL
BUN/Creatinine Ratio: 21 (ref 10–24)
BUN: 20 mg/dL (ref 8–27)
CO2: 21 mmol/L (ref 20–29)
Calcium: 8.9 mg/dL (ref 8.6–10.2)
Chloride: 102 mmol/L (ref 96–106)
Creatinine, Ser: 0.97 mg/dL (ref 0.76–1.27)
GFR calc Af Amer: 96 mL/min/{1.73_m2} (ref >59)
GFR calc non Af Amer: 83 mL/min/{1.73_m2} (ref >59)
Glucose: 96 mg/dL (ref 65–99)
Potassium: 4.3 mmol/L (ref 3.5–5.2)
Sodium: 137 mmol/L (ref 134–144)

## 2019-08-12 LAB — HEPATIC FUNCTION PANEL
ALT: 23 IU/L (ref 0–44)
AST: 24 IU/L (ref 0–40)
Albumin: 4.6 g/dL (ref 3.8–4.8)
Alkaline Phosphatase: 86 IU/L (ref 48–121)
Bilirubin Total: 1 mg/dL (ref 0.0–1.2)
Bilirubin, Direct: 0.23 mg/dL (ref 0.00–0.40)
Total Protein: 6.8 g/dL (ref 6.0–8.5)

## 2019-08-16 ENCOUNTER — Other Ambulatory Visit: Payer: Self-pay

## 2019-08-16 ENCOUNTER — Ambulatory Visit (INDEPENDENT_AMBULATORY_CARE_PROVIDER_SITE_OTHER)
Admission: RE | Admit: 2019-08-16 | Discharge: 2019-08-16 | Disposition: A | Discharge status: Home / Self Care | Payer: BC Managed Care – PPO | Source: Ambulatory Visit | Attending: Cardiovascular Disease | Admitting: Cardiovascular Disease

## 2019-08-16 DIAGNOSIS — I712 Thoracic aortic aneurysm, without rupture, unspecified: Secondary | ICD-10-CM

## 2019-08-16 MED ORDER — IOHEXOL 350 MG/ML SOLN
100.0000 mL | Freq: Once | INTRAVENOUS | Status: AC | PRN
  Administered 2019-08-16: 100 mL via INTRAVENOUS

## 2019-10-24 IMAGING — CT CT ANGIOGRAPHY CHEST
2 of 7 series · 18 of 46 positions shown · IV contrast (omnipaque)
Comparison: None.

CLINICAL DATA: Routine follow up thoracic aortic aneurysm.

EXAM:
CT ANGIOGRAPHY CHEST WITH CONTRAST
TECHNIQUE: Multidetector CT imaging of the chest was performed using the
standard protocol during bolus administration of intravenous
contrast. Multiplanar CT image reconstructions and MIPs were
obtained to evaluate the vascular anatomy.
CONTRAST:  100mL OMNIPAQUE IOHEXOL 350 MG/ML SOLN

[Series 4: aorta 3.0 i31f 2 · axial · 0.74mm/px · z∈[-330,-42]mm · 15 of 104 slices shown]
[im 4/104  lung]
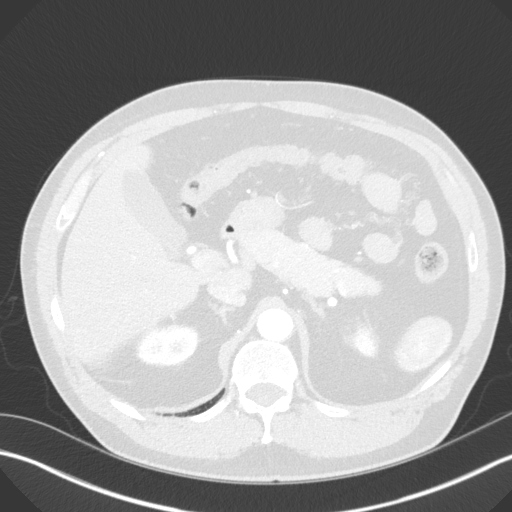
[im 12/104  soft-tissue]
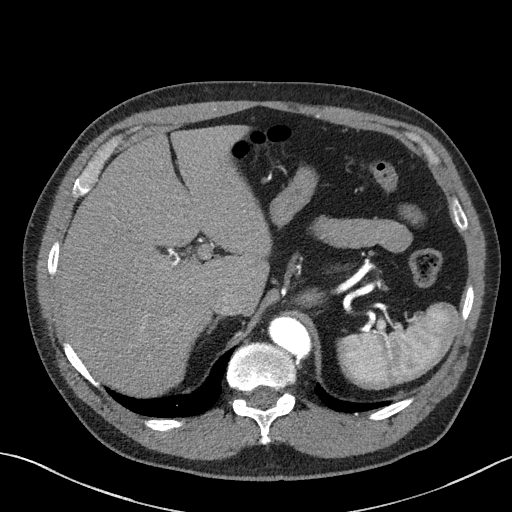
[im 20/104  lung]
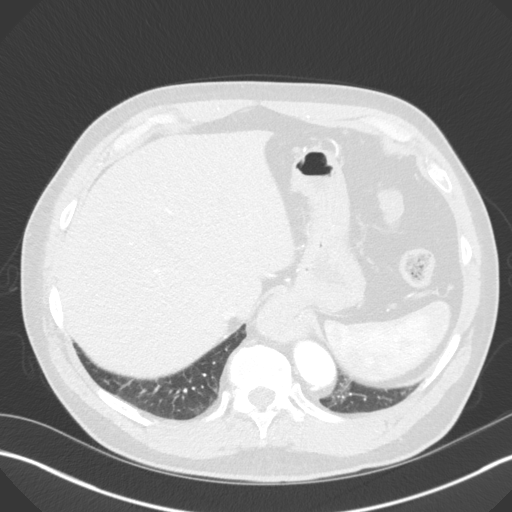
[im 27/104  soft-tissue]
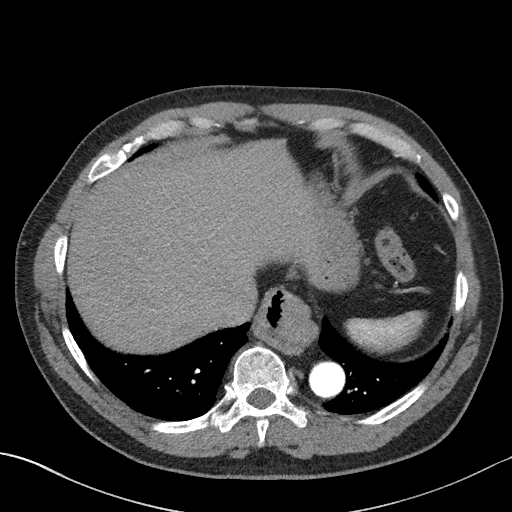
[im 31/104  lung]
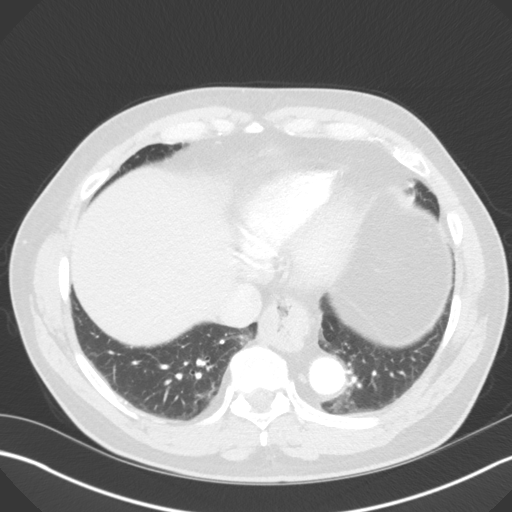
[im 39/104  soft-tissue]
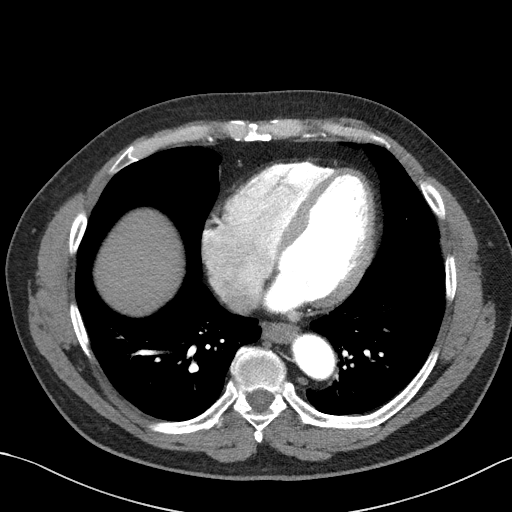
[im 46/104  lung]
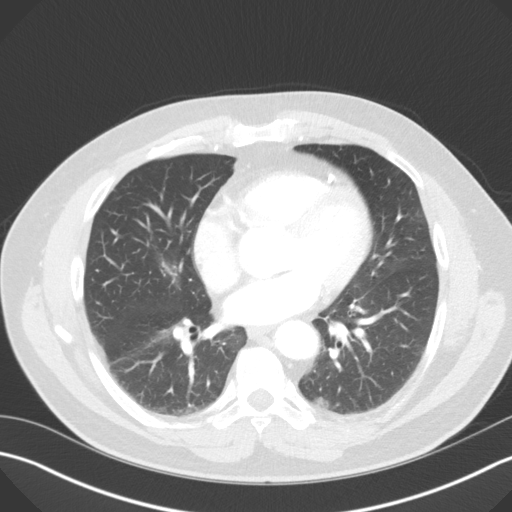
[im 54/104  soft-tissue]
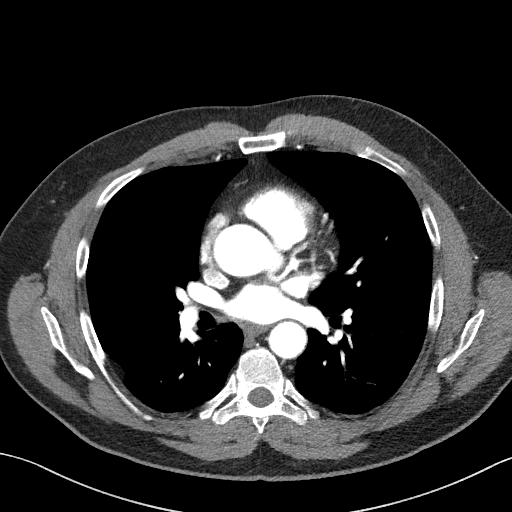
[im 58/104  lung]
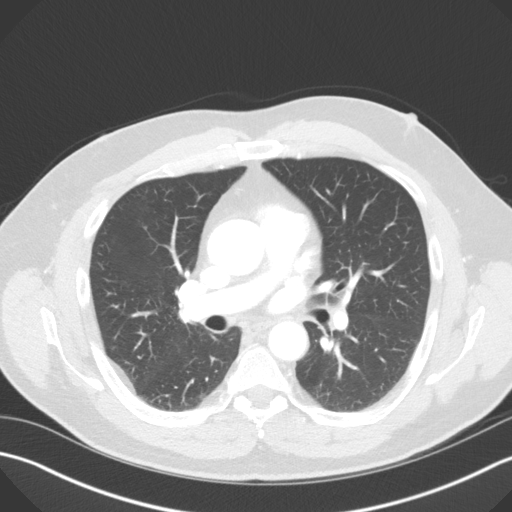
[im 65/104  soft-tissue]
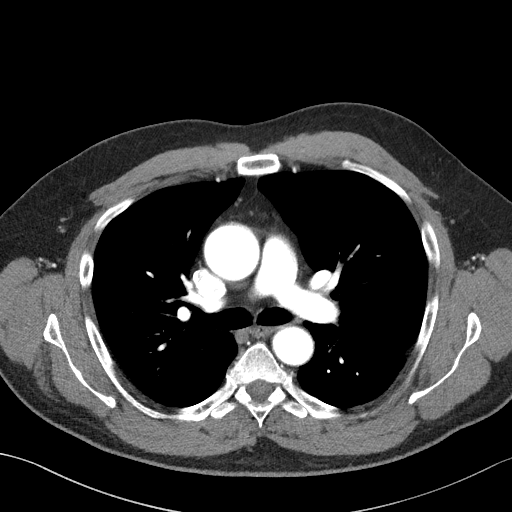
[im 73/104  lung]
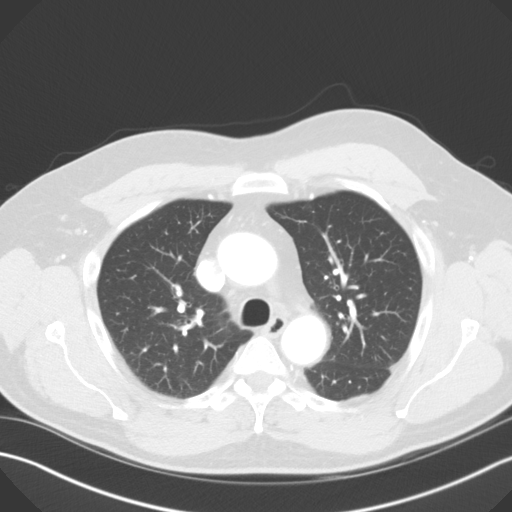
[im 77/104  soft-tissue]
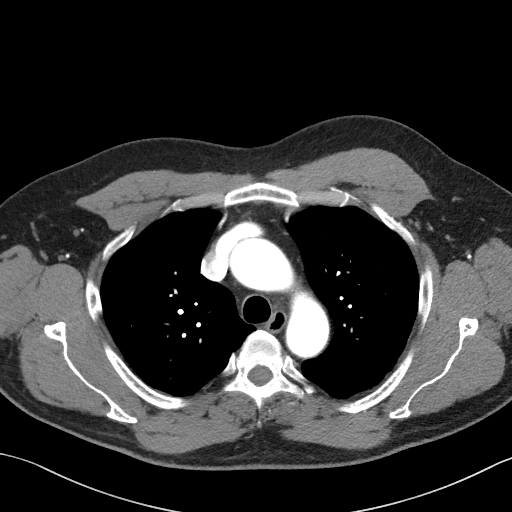
[im 84/104  lung]
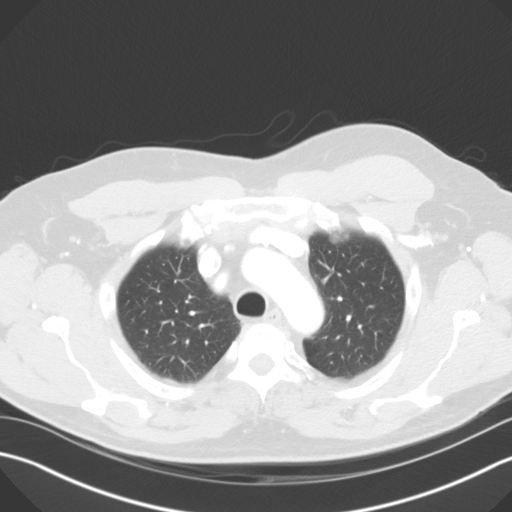
[im 92/104  soft-tissue]
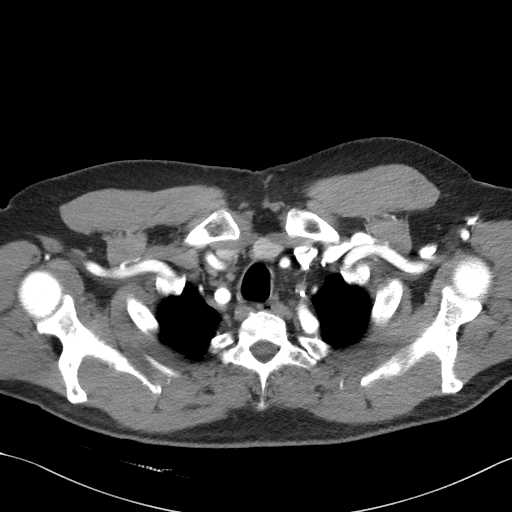
[im 100/104  lung]
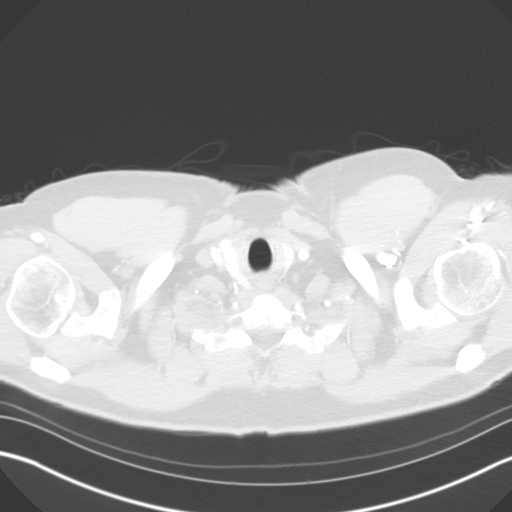

[Series 7: coronals · coronal · 0.61mm/px · 3 of 128 slices shown]
[im 32/128  soft-tissue]
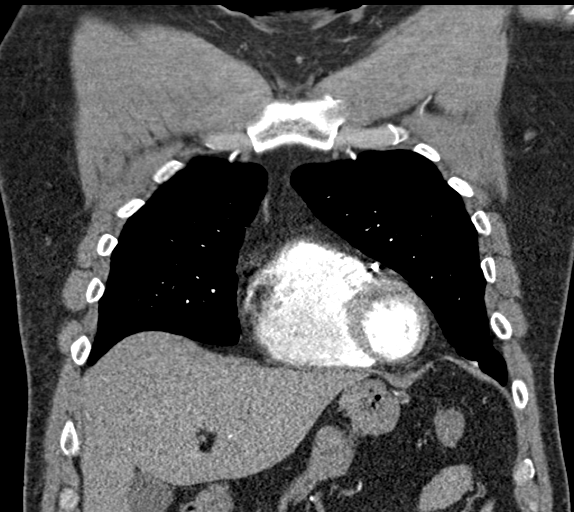
[im 64/128  soft-tissue]
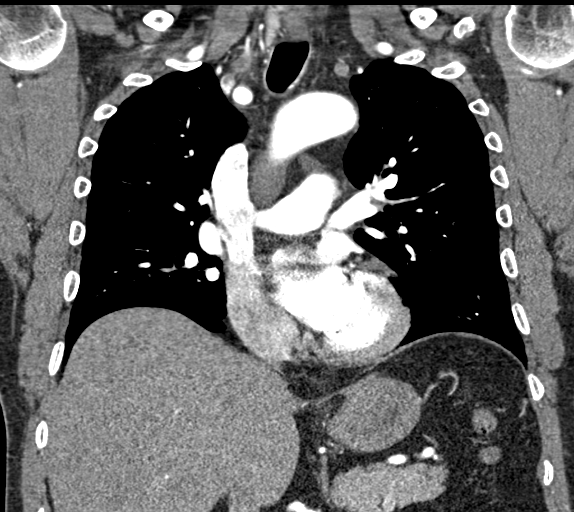
[im 96/128  soft-tissue]
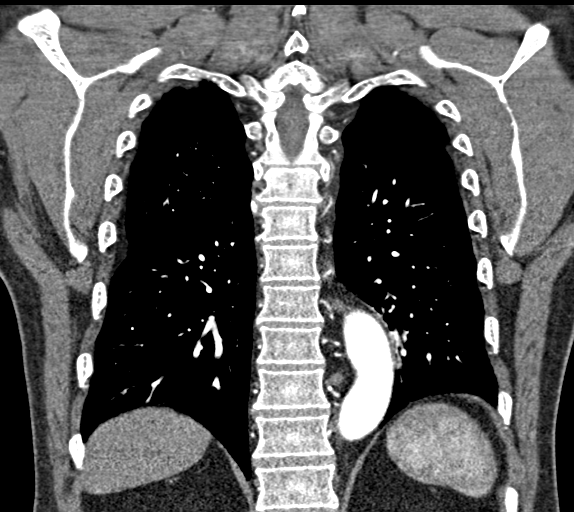

[18 of 46 positions shown; findings below may reference images not displayed]

FINDINGS: Cardiovascular: Preferential opacification of the thoracic aorta. No
thoracic aortic dissection. Ascending thoracic aortic aneurysm at
the level of the right main pulmonary artery measuring 4.2 cm in
diameter. Proximal aortic arch measures 3.7 cm in diameter. Distal
aortic arch measures 3 cm in diameter. Proximal descending thoracic
aorta measures 2.9 cm in diameter. Aorta at the diaphragmatic hiatus
measures 2.4 cm in diameter. Normal heart size. Minimal
atherosclerotic plaque in the LAD. No significant thoracic aortic
atherosclerosis. No pericardial effusion. Major branch vessels
arising from the aortic arch demonstrate no focal abnormality.

Mediastinum/Nodes: No enlarged mediastinal, hilar, or axillary lymph
nodes. Thyroid gland, trachea, and esophagus demonstrate no
significant findings.

Lungs/Pleura: Lungs are clear. No pleural effusion or pneumothorax.

Upper Abdomen: No acute upper abdominal abnormality. Moderate size
hiatal hernia.

Musculoskeletal: No acute osseous abnormality. No aggressive osseous
lesion.

Review of the MIP images confirms the above findings.
IMPRESSION: 1. Ascending thoracic aortic aneurysm measuring 4.2 cm in diameter.
Recommend annual imaging followup by CTA or MRA. This recommendation
follows 5757 ACCF/AHA/AATS/ACR/ASA/SCA/KLPIGBB/CARRASQUILLO/AMAZIGH/JEPSEN Guidelines
for the Diagnosis and Management of Patients with Thoracic Aortic
Disease. Circulation. 5757; 121: [id number]. Aortic aneurysm NOS
([id number])

## 2020-03-26 ENCOUNTER — Telehealth: Payer: Self-pay | Admitting: Cardiovascular Disease

## 2020-03-26 DIAGNOSIS — Z79899 Other long term (current) drug therapy: Secondary | ICD-10-CM

## 2020-03-26 DIAGNOSIS — I712 Thoracic aortic aneurysm, without rupture, unspecified: Secondary | ICD-10-CM

## 2020-03-26 DIAGNOSIS — I2510 Atherosclerotic heart disease of native coronary artery without angina pectoris: Secondary | ICD-10-CM

## 2020-03-26 DIAGNOSIS — I2583 Coronary atherosclerosis due to lipid rich plaque: Secondary | ICD-10-CM

## 2020-03-26 DIAGNOSIS — E785 Hyperlipidemia, unspecified: Secondary | ICD-10-CM

## 2020-03-26 NOTE — Telephone Encounter (Signed)
Please have blood work in 1 year before seeing Dr Elease Hashimoto. (BMP, Lipid,hepatic)    Spoke with the pts wife Marylu Lund (on [place]), and informed her that Dr. Elease Hashimoto requested the pt have labs done prior to his one year visit in May 29, 2019.  Labs needed are lipids/LFTs/BMP.  Orders placed and lab appt made for the pt for 05/21/20.  Pt and wife are aware that he needs to come fasting to this lab appt. Both parties verbalized understanding and agrees with this plan.

## 2020-03-26 NOTE — Telephone Encounter (Signed)
Marylu Lund called to schedule Rodney Santos's recall appt with Dr. Elease Hashimoto. In the appointment notes it states to have labs prior, but there is no active request in the system. Marylu Lund is requesting a callback to schedule once the order is placed. Please advise.

## 2020-05-21 ENCOUNTER — Other Ambulatory Visit: Payer: BC Managed Care – PPO | Admitting: *Deleted

## 2020-05-21 ENCOUNTER — Other Ambulatory Visit: Payer: Self-pay

## 2020-05-21 DIAGNOSIS — I2510 Atherosclerotic heart disease of native coronary artery without angina pectoris: Secondary | ICD-10-CM

## 2020-05-21 DIAGNOSIS — Z79899 Other long term (current) drug therapy: Secondary | ICD-10-CM

## 2020-05-21 DIAGNOSIS — I712 Thoracic aortic aneurysm, without rupture, unspecified: Secondary | ICD-10-CM

## 2020-05-21 DIAGNOSIS — E785 Hyperlipidemia, unspecified: Secondary | ICD-10-CM

## 2020-05-21 DIAGNOSIS — I2583 Coronary atherosclerosis due to lipid rich plaque: Secondary | ICD-10-CM

## 2020-05-21 LAB — HEPATIC FUNCTION PANEL
ALT: 23 IU/L (ref 0–44)
AST: 21 IU/L (ref 0–40)
Albumin: 4.5 g/dL (ref 3.8–4.8)
Alkaline Phosphatase: 86 IU/L (ref 44–121)
Bilirubin Total: 1.6 mg/dL — ABNORMAL HIGH (ref 0.0–1.2)
Bilirubin, Direct: 0.28 mg/dL (ref 0.00–0.40)
Total Protein: 6.7 g/dL (ref 6.0–8.5)

## 2020-05-21 LAB — BASIC METABOLIC PANEL
BUN/Creatinine Ratio: 13 (ref 10–24)
BUN: 12 mg/dL (ref 8–27)
CO2: 20 mmol/L (ref 20–29)
Calcium: 9 mg/dL (ref 8.6–10.2)
Chloride: 105 mmol/L (ref 96–106)
Creatinine, Ser: 0.94 mg/dL (ref 0.76–1.27)
Glucose: 105 mg/dL — ABNORMAL HIGH (ref 65–99)
Potassium: 3.9 mmol/L (ref 3.5–5.2)
Sodium: 140 mmol/L (ref 134–144)
eGFR: 92 mL/min/{1.73_m2} (ref >59)

## 2020-05-21 LAB — LIPID PANEL
Chol/HDL Ratio: 4.1 ratio (ref 0.0–5.0)
Cholesterol, Total: 134 mg/dL (ref 100–199)
HDL: 33 mg/dL — ABNORMAL LOW (ref >39)
LDL Chol Calc (NIH): 75 mg/dL (ref 0–99)
Triglycerides: 148 mg/dL (ref 0–149)
VLDL Cholesterol Cal: 26 mg/dL (ref 5–40)

## 2020-05-27 ENCOUNTER — Encounter: Payer: Self-pay | Admitting: Cardiovascular Disease
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
## 2020-05-28 ENCOUNTER — Encounter: Payer: Self-pay | Admitting: Cardiovascular Disease

## 2020-05-28 ENCOUNTER — Other Ambulatory Visit: Payer: Self-pay

## 2020-05-28 ENCOUNTER — Ambulatory Visit: Payer: BC Managed Care – PPO | Admitting: Cardiovascular Disease

## 2020-05-28 VITALS — BP 120/80 | HR 63 | Ht 70.0 in | Wt 219.0 lb

## 2020-05-28 DIAGNOSIS — I712 Thoracic aortic aneurysm, without rupture, unspecified: Secondary | ICD-10-CM

## 2020-05-28 NOTE — Patient Instructions (Signed)

## 2020-07-11 ENCOUNTER — Other Ambulatory Visit: Payer: Self-pay | Admitting: Cardiovascular Disease

## 2020-07-24 ENCOUNTER — Encounter: Payer: Self-pay | Admitting: Gastroenterology

## 2021-06-17 ENCOUNTER — Encounter: Payer: Self-pay | Admitting: Cardiovascular Disease
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
## 2021-06-18 ENCOUNTER — Encounter: Payer: Self-pay | Admitting: Cardiovascular Disease

## 2021-06-18 ENCOUNTER — Ambulatory Visit: Payer: BC Managed Care – PPO | Admitting: Cardiovascular Disease

## 2021-06-18 VITALS — BP 122/70 | HR 56 | Ht 70.0 in | Wt 216.4 lb

## 2021-06-18 DIAGNOSIS — I2510 Atherosclerotic heart disease of native coronary artery without angina pectoris: Secondary | ICD-10-CM

## 2021-06-18 DIAGNOSIS — E785 Hyperlipidemia, unspecified: Secondary | ICD-10-CM | POA: Diagnosis not present

## 2021-06-18 DIAGNOSIS — I2583 Coronary atherosclerosis due to lipid rich plaque: Secondary | ICD-10-CM | POA: Diagnosis not present

## 2021-06-18 DIAGNOSIS — I7121 Aneurysm of the ascending aorta, without rupture: Secondary | ICD-10-CM

## 2021-06-18 LAB — ALT: ALT: 23 IU/L (ref 0–44)

## 2021-06-18 LAB — LIPID PANEL
Chol/HDL Ratio: 4.1 ratio (ref 0.0–5.0)
Cholesterol, Total: 123 mg/dL (ref 100–199)
HDL: 30 mg/dL — ABNORMAL LOW (ref >39)
LDL Chol Calc (NIH): 74 mg/dL (ref 0–99)
Triglycerides: 98 mg/dL (ref 0–149)
VLDL Cholesterol Cal: 19 mg/dL (ref 5–40)

## 2021-06-18 LAB — BASIC METABOLIC PANEL
BUN/Creatinine Ratio: 10 (ref 10–24)
BUN: 10 mg/dL (ref 8–27)
CO2: 24 mmol/L (ref 20–29)
Calcium: 9.1 mg/dL (ref 8.6–10.2)
Chloride: 104 mmol/L (ref 96–106)
Creatinine, Ser: 0.98 mg/dL (ref 0.76–1.27)
Glucose: 110 mg/dL — ABNORMAL HIGH (ref 70–99)
Potassium: 4.1 mmol/L (ref 3.5–5.2)
Sodium: 140 mmol/L (ref 134–144)
eGFR: 87 mL/min/{1.73_m2} (ref >59)

## 2021-06-18 NOTE — Patient Instructions (Signed)
Medication Instructions:  ?Your physician recommends that you continue on your current medications as directed. Please refer to the Current Medication list given to you today. ? ?*If you need a refill on your cardiac medications before your next appointment, please call your pharmacy* ? ?Lab Work: ?TODAY: Lipids, ALT, BMP ?If you have labs (blood work) drawn today and your tests are completely normal, you will receive your results only by: ?MyChart Message (if you have MyChart) OR ?A paper copy in the mail ?If you have any lab test that is abnormal or we need to change your treatment, we will call you to review the results. ? ?Testing/Procedures: ?NONE ? ?Follow-Up: ?At [facility], you and your health needs are our priority.  As part of our continuing mission to provide you with exceptional heart care, we have created designated Provider Care Teams.  These Care Teams include your primary Cardiologist (physician) and Advanced Practice Providers (APPs -  Physician Assistants and Nurse Practitioners) who all work together to provide you with the care you need, when you need it. ? ?We recommend signing up for the patient portal called "MyChart".  Sign up information is provided on this After Visit Summary.  MyChart is used to connect with patients for Virtual Visits (Telemedicine).  Patients are able to view lab/test results, encounter notes, upcoming appointments, etc.  Non-urgent messages can be sent to your provider as well.   ?To learn more about what you can do with MyChart, go to [email or web address].   ? ?Your next appointment:   ?1 year(s) ? ?The format for your next appointment:   ?In Person ? ?Provider:   ?Philip Nahser, MD  or Vin Bhagat, PA-C or Scott Weaver, PA-C ?

## 2021-07-01 ENCOUNTER — Other Ambulatory Visit: Payer: Self-pay | Admitting: Cardiovascular Disease

## 2022-06-15 ENCOUNTER — Encounter: Payer: Self-pay | Admitting: Cardiovascular Disease
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
## 2022-06-16 ENCOUNTER — Ambulatory Visit: Payer: BC Managed Care – PPO | Attending: Cardiovascular Disease | Admitting: Cardiovascular Disease

## 2022-06-16 ENCOUNTER — Encounter: Payer: Self-pay | Admitting: Cardiovascular Disease

## 2022-06-16 VITALS — BP 120/89 | HR 62 | Ht 70.0 in | Wt 227.0 lb

## 2022-06-16 DIAGNOSIS — I7121 Aneurysm of the ascending aorta, without rupture: Secondary | ICD-10-CM | POA: Diagnosis not present

## 2022-06-16 DIAGNOSIS — E782 Mixed hyperlipidemia: Secondary | ICD-10-CM

## 2022-06-16 NOTE — Patient Instructions (Addendum)
Medication Instructions:  Your physician recommends that you continue on your current medications as directed. Please refer to the Current Medication list given to you today.  *If you need a refill on your cardiac medications before your next appointment, please call your pharmacy*   Lab Work: NONE If you have labs (blood work) drawn today and your tests are completely normal, you will receive your results only by: [place] (if you have MyChart) OR A paper copy in the mail If you have any lab test that is abnormal or we need to change your treatment, we will call you to review the results.   Testing/Procedures: CT of Aorta Non-Cardiac CT scanning, (CAT scanning), is a noninvasive, special x-ray that produces cross-sectional images of the body using x-rays and a computer. CT scans help physicians diagnose and treat medical conditions. For some CT exams, a contrast material is used to enhance visibility in the area of the body being studied. CT scans provide greater clarity and reveal more details than regular x-ray exams.  Follow-Up: At [facility], you and your health needs are our priority.  As part of our continuing mission to provide you with exceptional heart care, we have created designated Provider Care Teams.  These Care Teams include your primary Cardiologist (physician) and Advanced Practice Providers (APPs -  Physician Assistants and Nurse Practitioners) who all work together to provide you with the care you need, when you need it.  Your next appointment:   1 year(s)  Provider:   Mertie Moores, MD

## 2022-06-23 ENCOUNTER — Other Ambulatory Visit: Payer: Self-pay | Admitting: Cardiovascular Disease

## 2022-07-10 ENCOUNTER — Telehealth: Payer: Self-pay | Admitting: Cardiovascular Disease

## 2022-07-10 NOTE — Telephone Encounter (Signed)
Returned call to patient's spouse Marylu Lund.  Marylu Lund states patient saw Dr. Elease Hashimoto on 06/16/22 and a CT Angio Chest Aorta was ordered and they were told it would be about a week before hearing from scheduling re: appt date/time for this scan.  Marylu Lund states they have not heard from anyone yet.  Will forward to scheduling to contact patient to schedule date/time for CT Angio. Order in [place].

## 2022-07-10 NOTE — Telephone Encounter (Signed)
Pt's spouse would like a callback regarding CT ANGIO CHEST AORTA W/ & OR WO/CM & GATING. Please advise

## 2022-07-22 ENCOUNTER — Ambulatory Visit (HOSPITAL_COMMUNITY)
Admission: RE | Admit: 2022-07-22 | Discharge: 2022-07-22 | Disposition: A | Discharge status: Home / Self Care | Payer: Medicare Other | Source: Ambulatory Visit | Attending: Cardiovascular Disease | Admitting: Cardiovascular Disease

## 2022-07-22 DIAGNOSIS — E782 Mixed hyperlipidemia: Secondary | ICD-10-CM | POA: Diagnosis present

## 2022-07-22 DIAGNOSIS — I7121 Aneurysm of the ascending aorta, without rupture: Secondary | ICD-10-CM | POA: Insufficient documentation

## 2022-07-22 MED ORDER — IOHEXOL 350 MG/ML SOLN
75.0000 mL | Freq: Once | INTRAVENOUS | Status: AC | PRN
  Administered 2022-07-22: 75 mL via INTRAVENOUS

## 2022-07-25 ENCOUNTER — Telehealth: Payer: Self-pay | Admitting: Cardiovascular Disease

## 2022-07-25 DIAGNOSIS — I7121 Aneurysm of the ascending aorta, without rupture: Secondary | ICD-10-CM

## 2022-07-25 NOTE — Telephone Encounter (Signed)
-----   Message from Vesta Mixer, MD sent at 07/25/2022  7:52 AM EDT ----- Mild dilatation of the ascending aorta - unchanged from several previous studies over > 5 years.  Repeat Aortic CTA in 2 years

## 2022-07-25 NOTE — Telephone Encounter (Signed)
Patient has viewed results and recommendation to repeat CTA in 2 years. Order placed at this time. It will expire before time to repeat, but Nah[employer] is set to retire and I don't want it to fall through cracks. Placing order now, so that it can be followed by next provider.

## 2023-05-07 LAB — LAB REPORT - SCANNED
A1c: 6
EGFR: 75

## 2023-06-17 ENCOUNTER — Other Ambulatory Visit: Payer: Self-pay | Admitting: Cardiovascular Disease

## 2023-06-18 ENCOUNTER — Encounter: Payer: Self-pay | Admitting: Cardiovascular Disease
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
## 2023-06-19 ENCOUNTER — Ambulatory Visit: Payer: BC Managed Care – PPO | Attending: Internal Medicine | Admitting: Cardiovascular Disease

## 2023-06-19 VITALS — BP 122/78 | HR 75 | Wt 230.8 lb

## 2023-06-19 DIAGNOSIS — E782 Mixed hyperlipidemia: Secondary | ICD-10-CM

## 2023-06-19 DIAGNOSIS — I2510 Atherosclerotic heart disease of native coronary artery without angina pectoris: Secondary | ICD-10-CM | POA: Diagnosis present

## 2023-06-19 DIAGNOSIS — I2583 Coronary atherosclerosis due to lipid rich plaque: Secondary | ICD-10-CM | POA: Diagnosis not present

## 2023-06-19 DIAGNOSIS — I7121 Aneurysm of the ascending aorta, without rupture: Secondary | ICD-10-CM

## 2023-06-19 NOTE — Patient Instructions (Signed)
 Follow-Up: At [facility], you and your health needs are our priority.  As part of our continuing mission to provide you with exceptional heart care, our providers are all part of one team.  This team includes your primary Cardiologist (physician) and Advanced Practice Providers or APPs (Physician Assistants and Nurse Practitioners) who all work together to provide you with the care you need, when you need it.  Your next appointment:   1 year(s)  Provider:   Kristeen Miss, MD     1st Floor: - Lobby - Registration  - Pharmacy  - Lab - Cafe  2nd Floor: - PV Lab - Diagnostic Testing (echo, CT, nuclear med)  3rd Floor: - Vacant  4th Floor: - TCTS (cardiothoracic surgery) - AFib Clinic - Structural Heart Clinic - Vascular Surgery  - Vascular Ultrasound  5th Floor: - HeartCare Cardiology (general and EP) - Clinical Pharmacy for coumadin, hypertension, lipid, weight-loss medications, and med management appointments    Valet parking services will be available as well.

## 2023-09-13 ENCOUNTER — Other Ambulatory Visit: Payer: Self-pay | Admitting: Cardiovascular Disease

## 2024-06-13 ENCOUNTER — Ambulatory Visit: Admitting: Internal Medicine
